# Patient Record
Sex: Female | Born: 1965
Health system: Southern US, Community
[De-identification: ages and names within clinical notes are randomized; demographics above are authoritative.]

## PROBLEM LIST (undated history)

## (undated) DIAGNOSIS — C55 Malignant neoplasm of uterus, part unspecified: Secondary | ICD-10-CM

## (undated) DIAGNOSIS — I8390 Asymptomatic varicose veins of unspecified lower extremity: Secondary | ICD-10-CM

## (undated) DIAGNOSIS — J45909 Unspecified asthma, uncomplicated: Secondary | ICD-10-CM

## (undated) HISTORY — DX: Unspecified asthma, uncomplicated: J45.909

## (undated) HISTORY — PX: APPENDECTOMY: SHX54

## (undated) HISTORY — PX: BREAST BIOPSY: SHX20

## (undated) HISTORY — DX: Malignant neoplasm of uterus, part unspecified: C55

## (undated) HISTORY — DX: Asymptomatic varicose veins of unspecified lower extremity: I83.90

## (undated) HISTORY — PX: ABDOMINAL HYSTERECTOMY: SHX81

---

## 2010-12-30 ENCOUNTER — Other Ambulatory Visit: Payer: Self-pay | Admitting: Obstetrics and Gynecology

## 2010-12-30 DIAGNOSIS — N644 Mastodynia: Secondary | ICD-10-CM

## 2010-12-30 DIAGNOSIS — N63 Unspecified lump in unspecified breast: Secondary | ICD-10-CM

## 2011-01-06 ENCOUNTER — Ambulatory Visit
Admission: RE | Admit: 2011-01-06 | Discharge: 2011-01-06 | Disposition: A | Discharge status: Home / Self Care | Payer: 59 | Source: Ambulatory Visit | Attending: Obstetrics and Gynecology | Admitting: Obstetrics and Gynecology

## 2011-01-06 DIAGNOSIS — N63 Unspecified lump in unspecified breast: Secondary | ICD-10-CM

## 2011-01-06 DIAGNOSIS — N644 Mastodynia: Secondary | ICD-10-CM

## 2011-12-24 ENCOUNTER — Telehealth: Payer: Self-pay | Admitting: Obstetrics and Gynecology

## 2011-12-24 NOTE — Telephone Encounter (Signed)
berbera can you put this pt on mary sch tomorrow in the 10:45 or 11:00 slot ok per [facility]

## 2011-12-24 NOTE — Telephone Encounter (Signed)
Triage/appt

## 2011-12-24 NOTE — Telephone Encounter (Signed)
ND PT 

## 2011-12-25 ENCOUNTER — Encounter: Payer: Self-pay | Admitting: Obstetrics and Gynecology

## 2011-12-25 ENCOUNTER — Ambulatory Visit (INDEPENDENT_AMBULATORY_CARE_PROVIDER_SITE_OTHER): Payer: 59 | Admitting: Obstetrics and Gynecology

## 2011-12-25 VITALS — BP 120/78 | Resp 18 | Wt 175.0 lb

## 2011-12-25 DIAGNOSIS — N951 Menopausal and female climacteric states: Secondary | ICD-10-CM | POA: Insufficient documentation

## 2011-12-25 DIAGNOSIS — N760 Acute vaginitis: Secondary | ICD-10-CM

## 2011-12-25 DIAGNOSIS — B9689 Other specified bacterial agents as the cause of diseases classified elsewhere: Secondary | ICD-10-CM | POA: Insufficient documentation

## 2011-12-25 DIAGNOSIS — J45909 Unspecified asthma, uncomplicated: Secondary | ICD-10-CM | POA: Insufficient documentation

## 2011-12-25 DIAGNOSIS — Z202 Contact with and (suspected) exposure to infections with a predominantly sexual mode of transmission: Secondary | ICD-10-CM

## 2011-12-25 DIAGNOSIS — Z124 Encounter for screening for malignant neoplasm of cervix: Secondary | ICD-10-CM

## 2011-12-25 DIAGNOSIS — N898 Other specified noninflammatory disorders of vagina: Secondary | ICD-10-CM

## 2011-12-25 DIAGNOSIS — A499 Bacterial infection, unspecified: Secondary | ICD-10-CM

## 2011-12-25 DIAGNOSIS — C55 Malignant neoplasm of uterus, part unspecified: Secondary | ICD-10-CM

## 2011-12-25 LAB — POCT WET PREP (WET MOUNT)
Bacteria Wet Prep HPF POC: NEGATIVE
Clue Cells Wet Prep Whiff POC: NEGATIVE
WBC, Wet Prep HPF POC: NEGATIVE
pH: 5

## 2011-12-25 MED ORDER — METRONIDAZOLE 500 MG PO TABS: 500.0000 mg | ORAL_TABLET | Freq: Two times a day (BID) | ORAL | Status: AC

## 2011-12-25 MED ORDER — CALCIUM 600-200 MG-UNIT PO TABS: 1.0000 | ORAL_TABLET | Freq: Every day | ORAL | Status: AC

## 2011-12-25 MED ORDER — PANTOPRAZOLE SODIUM 20 MG PO TBEC: 40.0000 mg | DELAYED_RELEASE_TABLET | Freq: Every day | ORAL | Status: DC

## 2011-12-25 NOTE — Progress Notes (Signed)
  Subjective:    Monique Munoz is a 46 y.o. female who presents for an annual exam. The patient has no complaints today. The patient is sexually active. GYN screening history: last pap: was normal x 3 years. The patient wears seatbelts: yes. The patient participates in regular exercise: yes. Has the patient ever been transfused or tattooed?: not asked. The patient reports that there is not domestic violence in her life.   Menstrual History: OB History    Grav Para Term Preterm Abortions TAB SAB Ect Mult Living                 Nulliarus Menarche age:  No LMP recorded. Patient has had a hysterectomy.  see chart for review  Review of Systems Pertinent items are noted in HPI.    Objective:    BP 120/78  Resp 18  Wt 79.379 kg (175 lb)  General Appearance:    Alert, cooperative, no distress, appears stated age                 Neck:   Supple, symmetrical, trachea midline, no adenopathy;    thyroid:  no enlargement/tenderness/nodules; no carotid   bruit or JVD  Back:     Symmetric, no curvature, ROM normal, no CVA tenderness  Lungs:     Clear to auscultation bilaterally, respirations unlabored  Chest Wall:    No tenderness or deformity   Heart:    Regular rate and rhythm, S1 and S2 normal, no murmur, rub   or gallop  Breast Exam:    No tenderness, masses, or nipple abnormality  Abdomen:     Soft, non-tender, bowel sounds active all four quadrants,    no masses, no organomegaly  Genitalia:    Normal female without lesion, discharge, patien thas complained for thick vaginal discharge.no tenderness, normal adenexa cervix posterior  Rectal:  Visible wnl  Extremities:   Extremities normal, atraumatic, no cyanosis or edema  Pulses:   2+ and symmetric all extremities  Skin:   Skin color, texture, turgor normal, no rashes or lesion.no swelling and healing well. Had used topical anti histamine/ antibiotic ointment daily for past week. Patient had remains of spider bite on leg x1 week ago    Lymph nodes:   Cervical, supraclavicular, and axillary nodes normal  Neurologic:   CNII-XII intact, normal strength, sensation and reflexes    throughout  .    Assessment:    Healthy female exam.   GC and Chlamydia cultures to lab as per patient request. Wet Prep for diagnosis BV - Confirmed Clue Cells. Hx of normal Pap x 3 years  Plan:     Follow up as needed.  and annual, annual mamogram discussed GC/Chl cultures  Wet Prep - BV diagnosis  Tx Flagyl 500 mgs po x 7 days Tx Caltrate D one tab daily Protonix 40 mgs po daily to assist with BV colonization Advise given on management of "Hot Flushes" ( Does not want HRT) f/o I year, Earl Gala, CNM/Mary Elsie Saas 12/25/2011 Let message with Dr. Normand Sloop requesting recommendation if f/o  Pap to continue, discussed at office 7/11 with Dr. Normand Sloop will obtain pathology and surgical op note from hysterectomy now. Lavera Guise, CNM

## 2011-12-25 NOTE — Progress Notes (Signed)
Regular Periods: no partial hyst  Mammogram: yes scheduled next month   Monthly Breast Ex.: no Exercise: yes  Tetanus < 10 years: no Seatbelts: yes  NI. Bladder Functn.: yes Abuse at home: no  Daily BM's: no takes laxatives  Stressful Work: yes  Healthy Diet: yes Sigmoid-Colonoscopy: never  Calcium: no Medical problems this year: possible yeast infection recent antibiotic use    LAST PAP: 2012 wnl  Contraception: Partial hyst  Mammogram:  Scheduled next month   PCP: Marijean Bravo   PMH: no change per pt   FMH: no change per pt

## 2011-12-25 NOTE — Patient Instructions (Signed)
Patient has been advised re constipation due to over use of OTC iron  Patient has been advised re non pharmacological management of Hot Flushes Patient has been advised re use of probiotics for current BV

## 2011-12-25 NOTE — Progress Notes (Signed)
A user error has taken place: encounter opened in error, closed for administrative reasons.

## 2012-01-07 DIAGNOSIS — C55 Malignant neoplasm of uterus, part unspecified: Secondary | ICD-10-CM | POA: Insufficient documentation

## 2012-01-08 ENCOUNTER — Telehealth: Payer: Self-pay

## 2012-01-08 NOTE — Telephone Encounter (Signed)
TC from pt. Informed pt we need records from her hysterectomy per [facility] but she needs to sign a release form before they will release her records to [place]. Pt requests form to be emailed. Emailed consent form. Pt called back to confirm she received email and will complete form today.

## 2012-01-08 NOTE — Telephone Encounter (Signed)
Lm for pt need Op and Path report from her hysterectomy per MK.

## 2012-01-11 NOTE — Telephone Encounter (Signed)
TC from pt changed her mind about getting STD testing during 12/25/11 visit. Pt now wants all STD tests done. Pt wants to come tomorrow at 8:30 pm. Appt scheduled tomorrow @ 8:30 pm. Pt agrees and understands.

## 2012-01-12 ENCOUNTER — Ambulatory Visit: Payer: 59

## 2012-01-12 DIAGNOSIS — Z202 Contact with and (suspected) exposure to infections with a predominantly sexual mode of transmission: Secondary | ICD-10-CM

## 2012-01-12 LAB — HEPATITIS C ANTIBODY: HCV Ab: NEGATIVE

## 2012-01-12 LAB — HIV ANTIBODY (ROUTINE TESTING W REFLEX): HIV: NONREACTIVE

## 2012-01-12 LAB — HEPATITIS B SURFACE ANTIGEN: Hepatitis B Surface Ag: NEGATIVE

## 2012-01-12 LAB — RPR

## 2012-01-18 ENCOUNTER — Telehealth: Payer: Self-pay | Admitting: Obstetrics and Gynecology

## 2012-01-18 NOTE — Telephone Encounter (Signed)
Triage/epic 

## 2012-01-20 ENCOUNTER — Telehealth: Payer: Self-pay

## 2012-01-20 NOTE — Telephone Encounter (Signed)
Triage/res. °

## 2012-01-21 NOTE — Telephone Encounter (Signed)
TYVONNA/RETURN CALL

## 2012-01-21 NOTE — Telephone Encounter (Signed)
Tyvonna/call bck °

## 2012-02-15 ENCOUNTER — Encounter: Payer: Self-pay | Admitting: Obstetrics and Gynecology

## 2012-02-15 NOTE — Progress Notes (Unsigned)
Patient ID: Monique Munoz, female   DOB: May 14, 1966, 46 y.o.   MRN: [id number] 02/03/2012 Discussed with Dr. Normand Sloop records only for granular pap and breast biopsy in past month still needing OP report of hysterectomy and pathology for hx of uterine cancer pap. Instructed to take to [facility] in Med record and wait for results. Lavera Guise, CNM

## 2012-03-08 ENCOUNTER — Encounter: Payer: Self-pay | Admitting: Obstetrics and Gynecology

## 2012-03-08 NOTE — Progress Notes (Signed)
Patient ID: Monique Munoz, female   DOB: [birth year], 46 y.o.   MRN: [id number] Triage staff to notify pt unable after repetitive attempts to get pathology information regarding uterine cancer, needs f/o Pap now. Lavera Guise, CNM

## 2012-03-09 ENCOUNTER — Telehealth: Payer: Self-pay | Admitting: Obstetrics and Gynecology

## 2012-03-09 ENCOUNTER — Telehealth: Payer: Self-pay

## 2012-03-09 NOTE — Telephone Encounter (Signed)
TC TO PT REGARDING MESSAGE BELOW AND SCHEDULED PT WITH ND FOR F/U PAP ON 03/22/12 AT 2:45PM. PT ASKED ME TO ASK MARY TO GIVE PT A CALL AND I INFORMED PT THAT I WILL GIVE MK THE MESSAGE. PT UNDERSTANDS.

## 2012-03-09 NOTE — Telephone Encounter (Signed)
Message copied by Winfred Leeds on Wed Mar 09, 2012 12:12 PM ------      Message from: [name]      Created: Tue Mar 08, 2012  5:19 PM      Regarding: f/o pap with Dr. Karen Kitchens pt unable after repetitive attempts to get pathology information regarding uterine cancer, needs f/o Pap. Dr. Normand Sloop is aware of this pt schedule with her.      Monique Munoz

## 2012-03-22 ENCOUNTER — Encounter: Payer: 59 | Admitting: Obstetrics and Gynecology

## 2012-03-23 ENCOUNTER — Telehealth: Payer: Self-pay

## 2012-03-23 NOTE — Telephone Encounter (Signed)
Spoke with pt rgd appt from 03/22/12 advised pt spoke with nd and she do need pap smear pt was unable to wait yesterday due to ND running late due to surgery pt has appt 03/24/12 at 10:00 with [facility] pt voice understanding

## 2012-03-24 ENCOUNTER — Encounter: Payer: Self-pay | Admitting: Obstetrics and Gynecology

## 2012-03-24 ENCOUNTER — Encounter: Payer: 59 | Admitting: Obstetrics and Gynecology

## 2012-03-24 NOTE — Progress Notes (Deleted)
Last Pap Normal: yes Date: 12/2010 Grade: n/a High Risk HPV: no Vaginal Discharge:no Prior LEEP:no Prior Conization:no Prior Cryotherapy:no Prior Lazer:no  Subjective:    Monique Munoz is a 46 y.o. female, G0P0, who presents for ***.   The following portions of the patient's history were reviewed and updated as appropriate: allergies, current medications, past family history.  Review of Systems Pertinent items are noted in HPI. Breast:Negative for breast lump,nipple discharge or nipple retraction Gastrointestinal: Negative for abdominal pain, change in bowel habits or rectal bleeding Urinary:negative   Objective:    BP 98/78  Wt 181 lb (82.101 kg)    Weight:  Wt Readings from Last 1 Encounters:  03/24/12 181 lb (82.101 kg)          BMI: There is no height on file to calculate BMI.  General Appearance: Alert, appropriate appearance for age. No acute distress GYN exam:   Assessment:    {diagnoses; exam gyn:13148}    Plan:    {gyn plan:315269::"mammogram","pap smear","return annually or prn"}     MD

## 2012-03-24 NOTE — Progress Notes (Signed)
Last Pap Normal: yes Date: 12/30/10 Grade: n/a High Risk HPV: no Vaginal Discharge:no Prior LEEP:no Prior Conization:no Prior Cryotherapy:no Prior Lazer:no

## 2012-03-31 NOTE — Progress Notes (Signed)
This encounter was created in error - please disregard.

## 2012-04-04 ENCOUNTER — Ambulatory Visit (INDEPENDENT_AMBULATORY_CARE_PROVIDER_SITE_OTHER): Payer: 59 | Admitting: Obstetrics and Gynecology

## 2012-04-04 ENCOUNTER — Encounter: Payer: Self-pay | Admitting: Obstetrics and Gynecology

## 2012-04-04 VITALS — BP 110/62

## 2012-04-04 DIAGNOSIS — Z124 Encounter for screening for malignant neoplasm of cervix: Secondary | ICD-10-CM

## 2012-04-04 DIAGNOSIS — N951 Menopausal and female climacteric states: Secondary | ICD-10-CM

## 2012-04-04 NOTE — Progress Notes (Signed)
Last Pap Normal: yes Date: 12/30/10 Grade: n/a High Risk HPV: no Vaginal Discharge:no Prior LEEP:no Prior Conization:no Prior Cryotherapy:no Prior Lazer:no BP 110/62 Pt had a hysterectomy for what she believes was uterine cancer.  She said she had a cone bx and her MD found cancer.  She had an abdominal hysterectomy with preservation of her ovaries.  She did not receive chemo or radiation.  She had this in 2001.  She also c/o menopausal sxs.  She has hot flushes and trouble sleeping.  BP 110/62 Physical Examination: Abdomen - soft, nontender, nondistended, no masses or organomegaly Pelvic vulva and vagina are normal.  Cervix is missing and uterus is missing H/o pelvic cancer.  Menopausal symptoms  It sounds like cervical cancer.  Pt will always need yearly  paps ROI from surgery

## 2012-04-04 NOTE — Patient Instructions (Signed)
Menopause and Herbal Products Menopause is the normal time of life when menstrual periods stop completely. Menopause is complete when you have missed 12 consecutive menstrual periods. It usually occurs between the ages of 48 to 55, with an average age of 51. Very rarely does a woman develop menopause before 47 years old. At menopause, your ovaries stop producing the female hormones, estrogen and progesterone. This can cause undesirable symptoms and also affect your health. Sometimes the symptoms can occur 4 to 5 years before the menopause begins. There is no relationship between menopause and:  Oral contraceptives.  Number of children you had.  Race.  The age your menstrual periods started (menarche). Heavy smokers and very thin women may develop menopause earlier in life. Estrogen and progesterone hormone treatment is the usual method of treating menopausal symptoms. However, there are women who should not take hormone treatment. This is true of:   Women that have breast or uterine cancer.  Women who prefer not to take hormones because of certain side effects (abnormal uterine bleeding).  Women who are afraid that hormones may cause breast cancer.  Women who have a history of liver disease, heart disease, stroke, or blood clots. For these women, there are other medications that may help treat their menopausal symptoms. These medications are found in plants and botanical products. They can be found in the form of herbs, teas, oils, tinctures, and pills.  CAUSES:  The ovaries stop producing the female hormones estrogen and progesterone.  Other causes include:  Surgery to remove both ovaries.  The ovaries stop functioning for no know reason.  Tumors of the pituitary gland in the brain.  Medical disease that affects the ovaries and hormone production.  Radiation treatment to the abdomen or pelvis.  Chemotherapy that affects the ovaries. PHYTOESTROGENS: Phytoestrogen's occur  naturally in plants and plant products. They act like estrogen in the body. Herbal medications are made from these plants and botanical steroids. There are 3 types of phytoestrogens:  Isoflavones (genistein and daidzein) are found in soy, garbanzo beans, miso and tofu foods.  Ligins are found in the shell of seeds. They are used to make oils like flaxseed oil. The bacteria in your intestine act on these foods to produce the estrogen-like hormones.  Coumestans are estrogen-like. Some of the foods they are found in include sunflower seeds and bean sprouts. CONDITIONS AND THERE POSSIBLE HERBAL TREATMENT:  Hot flashes and night sweats.  Soy, black cohosh and evening primrose.  Irritability, insomnia, depression and memory problems.  Chasteberry, ginseng, and soy.  St. John's wort may be helpful for depression. However, there is a concern of it causing cataracts of the eye and may have bad effects on other medications. St. John's wort should not be taken for long time and without your caregiver's advice.  Loss of libido and vaginal and skin dryness.  Wild yam and soy.  Prevention of coronary heart disease and osteoporosis.  Soy and Isoflavones. Several studies have shown that some women benefit from herbal medications, but most of the studies have not consistently shown that these supplements are much better than placebo. Other forms of treatment to help women with menopausal symptoms include a balanced diet, rest, exercise, vitamin and calcium (with vitamin D) supplements, acupuncture, and group therapy when necessary. THOSE WHO SHOULD NOT TAKE HERBAL MEDICATIONS INCLUDE:  Women who are planning on getting pregnant unless told by your caregiver.  Women who are breastfeeding unless told by your caregiver.  Women who are taking other   prescription medications unless told by your caregiver.  Infants, children, and elderly women unless told by your caregiver. Different herbal medications  have different and unmeasured amounts of the herbal ingredients. There are no regulations, quality control, and standardization of the ingredients in herbal medications. Therefore, the amount of the ingredient in the medication may vary from one herb, pill, tea, oil or tincture to another. Many herbal medications can cause serious problems and can even have poisonous effects if taken too much or too long. If problems develop, the medication should be stopped and recorded by your caregiver. HOME CARE INSTRUCTIONS  Do not take or give children herbal medications without your caregiver's advice.  Let your caregiver know all the medications you are taking. This includes prescription, over-the-counter, eye drops, and creams.  Do not take herbal medications longer or more than recommended.  Tell your caregiver about any side effects from the medication. SEEK MEDICAL CARE IF:  You develop a fever of 102 F (38.9 C), or as directed by your caregiver.  You feel sick to your stomach (nauseous), vomit, or have diarrhea.  You develop a rash.  You develop abdominal pain.  You develop severe headaches.  You start to have vision problems.  You feel dizzy or faint.  You start to feel numbness in any part of your body.  You start shaking (have convulsions). Document Released: 12/02/2007 Document Revised: 09/07/2011 Document Reviewed: 07/01/2010 ExitCare Patient Information 2013 [employer].  

## 2012-04-05 LAB — PAP IG W/ RFLX HPV ASCU

## 2012-04-19 ENCOUNTER — Telehealth: Payer: Self-pay

## 2012-04-19 ENCOUNTER — Telehealth: Payer: Self-pay | Admitting: Obstetrics and Gynecology

## 2012-04-19 NOTE — Telephone Encounter (Signed)
Pt made aware pap results were normal. Pt voiced understanding.  [facility] CMA

## 2012-04-19 NOTE — Telephone Encounter (Signed)
Pt was called back regarding message for Lab Results.  [facility] CMA

## 2013-04-03 DIAGNOSIS — L649 Androgenic alopecia, unspecified: Secondary | ICD-10-CM | POA: Insufficient documentation

## 2013-04-03 DIAGNOSIS — L6681 Central centrifugal cicatricial alopecia: Secondary | ICD-10-CM | POA: Insufficient documentation

## 2013-04-03 DIAGNOSIS — L669 Cicatricial alopecia, unspecified: Secondary | ICD-10-CM | POA: Insufficient documentation

## 2013-11-09 ENCOUNTER — Other Ambulatory Visit: Payer: Self-pay | Admitting: Obstetrics and Gynecology

## 2013-11-09 DIAGNOSIS — Z1231 Encounter for screening mammogram for malignant neoplasm of breast: Secondary | ICD-10-CM

## 2013-11-14 ENCOUNTER — Encounter (INDEPENDENT_AMBULATORY_CARE_PROVIDER_SITE_OTHER): Payer: Self-pay

## 2013-11-14 ENCOUNTER — Ambulatory Visit
Admission: RE | Admit: 2013-11-14 | Discharge: 2013-11-14 | Disposition: A | Discharge status: Home / Self Care | Payer: 59 | Source: Ambulatory Visit | Attending: Obstetrics and Gynecology | Admitting: Obstetrics and Gynecology

## 2013-11-14 DIAGNOSIS — Z1231 Encounter for screening mammogram for malignant neoplasm of breast: Secondary | ICD-10-CM

## 2013-11-16 ENCOUNTER — Other Ambulatory Visit: Payer: Self-pay | Admitting: Obstetrics and Gynecology

## 2013-11-16 DIAGNOSIS — R928 Other abnormal and inconclusive findings on diagnostic imaging of breast: Secondary | ICD-10-CM

## 2013-11-28 ENCOUNTER — Ambulatory Visit
Admission: RE | Admit: 2013-11-28 | Discharge: 2013-11-28 | Disposition: A | Discharge status: Home / Self Care | Payer: 59 | Source: Ambulatory Visit | Attending: Obstetrics and Gynecology | Admitting: Obstetrics and Gynecology

## 2013-11-28 DIAGNOSIS — R928 Other abnormal and inconclusive findings on diagnostic imaging of breast: Secondary | ICD-10-CM

## 2014-05-14 ENCOUNTER — Other Ambulatory Visit: Payer: Self-pay | Admitting: Obstetrics and Gynecology

## 2014-05-14 DIAGNOSIS — N6001 Solitary cyst of right breast: Secondary | ICD-10-CM

## 2014-05-21 ENCOUNTER — Ambulatory Visit
Admission: RE | Admit: 2014-05-21 | Discharge: 2014-05-21 | Disposition: A | Discharge status: Home / Self Care | Payer: 59 | Source: Ambulatory Visit | Attending: Obstetrics and Gynecology | Admitting: Obstetrics and Gynecology

## 2014-05-21 ENCOUNTER — Encounter (INDEPENDENT_AMBULATORY_CARE_PROVIDER_SITE_OTHER): Payer: Self-pay

## 2014-05-21 DIAGNOSIS — N6001 Solitary cyst of right breast: Secondary | ICD-10-CM

## 2014-07-06 ENCOUNTER — Emergency Department (HOSPITAL_COMMUNITY): Payer: 59

## 2014-07-06 ENCOUNTER — Encounter (HOSPITAL_COMMUNITY): Payer: Self-pay | Admitting: Emergency Medicine

## 2014-07-06 ENCOUNTER — Emergency Department (HOSPITAL_COMMUNITY)
Admission: EM | Admit: 2014-07-06 | Discharge: 2014-07-06 | Disposition: A | Discharge status: Home / Self Care | Payer: 59 | Attending: Emergency Medicine | Admitting: Emergency Medicine

## 2014-07-06 DIAGNOSIS — Z7951 Long term (current) use of inhaled steroids: Secondary | ICD-10-CM | POA: Insufficient documentation

## 2014-07-06 DIAGNOSIS — Z8542 Personal history of malignant neoplasm of other parts of uterus: Secondary | ICD-10-CM | POA: Insufficient documentation

## 2014-07-06 DIAGNOSIS — M62830 Muscle spasm of back: Secondary | ICD-10-CM

## 2014-07-06 DIAGNOSIS — J45909 Unspecified asthma, uncomplicated: Secondary | ICD-10-CM | POA: Insufficient documentation

## 2014-07-06 DIAGNOSIS — R0602 Shortness of breath: Secondary | ICD-10-CM | POA: Diagnosis not present

## 2014-07-06 DIAGNOSIS — Z79899 Other long term (current) drug therapy: Secondary | ICD-10-CM | POA: Insufficient documentation

## 2014-07-06 DIAGNOSIS — R0789 Other chest pain: Secondary | ICD-10-CM | POA: Insufficient documentation

## 2014-07-06 DIAGNOSIS — R079 Chest pain, unspecified: Secondary | ICD-10-CM | POA: Diagnosis present

## 2014-07-06 LAB — CBC
HCT: 40.5 % (ref 36.0–46.0)
HEMOGLOBIN: 13.9 g/dL (ref 12.0–15.0)
MCH: 31 pg (ref 26.0–34.0)
MCHC: 34.3 g/dL (ref 30.0–36.0)
MCV: 90.2 fL (ref 78.0–100.0)
Platelets: 312 10*3/uL (ref 150–400)
RBC: 4.49 MIL/uL (ref 3.87–5.11)
RDW: 12.3 % (ref 11.5–15.5)
WBC: 11.6 10*3/uL — ABNORMAL HIGH (ref 4.0–10.5)

## 2014-07-06 LAB — BASIC METABOLIC PANEL
Anion gap: 9 (ref 5–15)
BUN: 15 mg/dL (ref 6–23)
CO2: 26 mmol/L (ref 19–32)
Calcium: 9.1 mg/dL (ref 8.4–10.5)
Chloride: 104 mEq/L (ref 96–112)
Creatinine, Ser: 0.78 mg/dL (ref 0.50–1.10)
GFR calc Af Amer: >90 mL/min (ref >90)
Glucose, Bld: 83 mg/dL (ref 70–99)
Potassium: 3.6 mmol/L (ref 3.5–5.1)
Sodium: 139 mmol/L (ref 135–145)

## 2014-07-06 LAB — I-STAT TROPONIN, ED: TROPONIN I, POC: 0 ng/mL (ref 0.00–0.08)

## 2014-07-06 LAB — TROPONIN I

## 2014-07-06 MED ORDER — HYDROCODONE-ACETAMINOPHEN 5-325 MG PO TABS: 1.0000 | ORAL_TABLET | Freq: Four times a day (QID) | ORAL | Status: DC | PRN

## 2014-07-06 MED ORDER — LORAZEPAM 0.5 MG PO TABS: 0.5000 mg | ORAL_TABLET | Freq: Three times a day (TID) | ORAL | Status: DC

## 2014-07-06 NOTE — ED Notes (Signed)
Pt c/o left sided rib pain worse with cough and inspiration into chest; pt sts hx of similar when had PNA; pt sts increased pain with movement

## 2014-07-06 NOTE — ED Provider Notes (Signed)
CSN: [id number]     Arrival date & time 07/06/14  1039 History   First MD Initiated Contact with Patient 07/06/14 1146     Chief Complaint  Patient presents with  . rib pain      (Consider location/radiation/quality/duration/timing/severity/associated sxs/prior Treatment) HPI   49 y/o female who is sent from primary care office for back pain, cp, and sob. Patient was reaching down toward her ankle whenshe had sudden onset  Back spasm and chest pain. She was unable to move out of the position. She felt her chest wall lock down . She was unable to take deep breaths due to pain. She had severe pain with movement and twisting. Derry Lory denies history of PE or DVT, has not history of cancer treatment, recent immobilization, recent trauma or surgery, denies hemoptysis, is not taking exogenous estrogen and has no unilateral leg swelling. She was seen at pcp and given muscle relaxer and toradol with dx of muscle spasm. As she did not have complete relief of sxs she was sent to the ED. The patient has recently started working out with a trainer and has been weight lifting. She is PERC negative in the ED.Denies DOE,, chest tightness or pressure, radiation to left arm, jaw or back, nausea, or diaphoresis.   Past Medical History  Diagnosis Date  . Asthma   . Uterine cancer    Past Surgical History  Procedure Laterality Date  . Abdominal hysterectomy  10 yrs ago    Hx ca   History reviewed. No pertinent family history. History  Substance Use Topics  . Smoking status: Never Smoker   . Smokeless tobacco: Never Used  . Alcohol Use: Yes     Comment: occasional wine   OB History    Gravida Para Term Preterm AB TAB SAB Ectopic Multiple Living   0              Review of Systems    Allergies  Peanuts; Shellfish allergy; and Fruit & vegetable daily  Home Medications   Prior to Admission medications   Medication Sig Start Date End Date Taking? Authorizing Provider  albuterol (PROVENTIL  HFA;VENTOLIN HFA) 108 (90 BASE) MCG/ACT inhaler Inhale 2 puffs into the lungs every 6 (six) hours as needed for wheezing or shortness of breath.   Yes Historical Provider, MD  albuterol (PROVENTIL) (2.5 MG/3ML) 0.083% nebulizer solution Take 2.5 mg by nebulization every 6 (six) hours as needed for wheezing or shortness of breath.    Yes Historical Provider, MD  fluticasone-salmeterol (ADVAIR HFA) 115-21 MCG/ACT inhaler Inhale 2 puffs into the lungs 2 (two) times daily.   Yes Historical Provider, MD  montelukast (SINGULAIR) 10 MG tablet Take 10 mg by mouth daily. 06/25/14  Yes Historical Provider, MD  Multiple Vitamins-Minerals (MULTIVITAMIN PO) Take 1 tablet by mouth daily.   Yes Historical Provider, MD  Calcium 600-200 MG-UNIT per tablet Take 1 tablet by mouth daily. 12/25/11 12/24/12  Hetty Ely, CNM  HYDROcodone-acetaminophen (NORCO) 5-325 MG per tablet Take 1 tablet by mouth every 6 (six) hours as needed for moderate pain. 07/06/14   Margarita Mail, PA-C  LORazepam (ATIVAN) 0.5 MG tablet Take 1 tablet (0.5 mg total) by mouth every 8 (eight) hours. 07/06/14   Margarita Mail, PA-C  pantoprazole (PROTONIX) 20 MG tablet Take 2 tablets (40 mg total) by mouth daily. 12/25/11 12/24/12  Hetty Ely, CNM   BP 96/55 mmHg  Pulse 68  Temp(Src) 97.6 F (36.4 C) (Oral)  Resp 15  SpO2  100% Physical Exam  Constitutional: She is oriented to person, place, and time. She appears well-developed and well-nourished. No distress.  HENT:  Head: Normocephalic and atraumatic.  Eyes: Conjunctivae are normal. No scleral icterus.  Neck: Normal range of motion.  Cardiovascular: Normal rate, regular rhythm and normal heart sounds.  Exam reveals no gallop and no friction rub.   No murmur heard. Pulmonary/Chest: Effort normal and breath sounds normal. No respiratory distress.   She exhibits tenderness.    Abdominal: Soft. Bowel sounds are normal. She exhibits no distension and no mass. There is no tenderness. There  is no guarding.  Neurological: She is alert and oriented to person, place, and time.  Skin: Skin is warm and dry. She is not diaphoretic.  Nursing note and vitals reviewed.   ED Course  Procedures (including critical care time) Labs Review Labs Reviewed  CBC - Abnormal; Notable for the following:    WBC 11.6 (*)    All other components within normal limits  TROPONIN I  BASIC METABOLIC PANEL  I-STAT TROPOININ, ED    Imaging Review No results found.   EKG Interpretation   Date/Time:  Friday July 06 2014 10:45:54 EST Ventricular Rate:  70 PR Interval:  138 QRS Duration: 80 QT Interval:  396 QTC Calculation: 427 R Axis:   91 Text Interpretation:  Normal sinus rhythm Rightward axis Borderline ECG ED  PHYSICIAN INTERPRETATION AVAILABLE IN [facility] Confirmed by TEST,  Record ([id number]) on 07/08/2014 8:55:52 AM      MDM   Final diagnoses:  SOB (shortness of breath)  Back muscle spasm  Acute chest wall pain    patient with pain on palpation. Reproducible. WBC count is elevated which I feel is acute phase reaction CXR is unremarkable. EKG shows right axis with no signs of ischemia. Her troponin is negative and HEART score is 2 making her low risk for MACE.  Patient is to be discharged with recommendation to follow up with PCP in regards to today's hospital visit. Chest pain is not likely of cardiac or pulmonary etiology d/t presentation, perc negative, VSS, no tracheal deviation, no JVD or new murmur, RRR, breath sounds equal bilaterally, EKG without acute abnormalities, negative troponin, and negative CXR. Pt has been advised to return to the ED is CP becomes exertional, associated with diaphoresis or nausea, radiates to left jaw/arm, worsens or becomes concerning in any way. Pt appears reliable for follow up and is agreeable to discharge.   Case has been discussed with and seen by Dr. Johnney Killian who agrees with the above plan to discharge.    Margarita Mail,  PA-C 07/12/14 [place], MD 07/13/14 [phone]

## 2014-07-06 NOTE — Discharge Instructions (Signed)
Muscle Cramps and Spasms Muscle cramps and spasms occur when a muscle or muscles tighten and you have no control over this tightening (involuntary muscle contraction). They are a common problem and can develop in any muscle. The most common place is in the calf muscles of the leg. Both muscle cramps and muscle spasms are involuntary muscle contractions, but they also have differences:   Muscle cramps are sporadic and painful. They may last a few seconds to a quarter of an hour. Muscle cramps are often more forceful and last longer than muscle spasms.  Muscle spasms may or may not be painful. They may also last just a few seconds or much longer. CAUSES  It is uncommon for cramps or spasms to be due to a serious underlying problem. In many cases, the cause of cramps or spasms is unknown. Some common causes are:   Overexertion.   Overuse from repetitive motions (doing the same thing over and over).   Remaining in a certain position for a long period of time.   Improper preparation, form, or technique while performing a sport or activity.   Dehydration.   Injury.   Side effects of some medicines.   Abnormally low levels of the salts and ions in your blood (electrolytes), especially potassium and calcium. This could happen if you are taking water pills (diuretics) or you are pregnant.  Some underlying medical problems can make it more likely to develop cramps or spasms. These include, but are not limited to:   Diabetes.   Parkinson disease.   Hormone disorders, such as thyroid problems.   Alcohol abuse.   Diseases specific to muscles, joints, and bones.   Blood vessel disease where not enough blood is getting to the muscles.  HOME CARE INSTRUCTIONS   Stay well hydrated. Drink enough water and fluids to keep your urine clear or pale yellow.  It may be helpful to massage, stretch, and relax the affected muscle.  For tight or tense muscles, use a warm towel, heating  pad, or hot shower water directed to the affected area.  If you are sore or have pain after a cramp or spasm, applying ice to the affected area may relieve discomfort.  Put ice in a plastic bag.  Place a towel between your skin and the bag.  Leave the ice on for 15-20 minutes, 03-04 times a day.  Medicines used to treat a known cause of cramps or spasms may help reduce their frequency or severity. Only take over-the-counter or prescription medicines as directed by your caregiver. SEEK MEDICAL CARE IF:  Your cramps or spasms get more severe, more frequent, or do not improve over time.  MAKE SURE YOU:   Understand these instructions.  Will watch your condition.  Will get help right away if you are not doing well or get worse. Document Released: 12/05/2001 Document Revised: 10/10/2012 Document Reviewed: 06/01/2012 [facility] Patient Information 2015 [place], [place]. This information is not intended to replace advice given to you by your health care provider. Make sure you discuss any questions you have with your health care provider. CChest Wall Pain Chest wall pain is pain in or around the bones and muscles of your chest. It may take up to 6 weeks to get better. It may take longer if you must stay physically active in your work and activities.  CAUSES  Chest wall pain may happen on its own. However, it may be caused by:  A viral illness like the flu.  Injury.  Coughing.  Exercise.  Arthritis.  Fibromyalgia.  Shingles. HOME CARE INSTRUCTIONS   Avoid overtiring physical activity. Try not to strain or perform activities that cause pain. This includes any activities using your chest or your abdominal and side muscles, especially if heavy weights are used.  Put ice on the sore area.  Put ice in a plastic bag.  Place a towel between your skin and the bag.  Leave the ice on for 15-20 minutes per hour while awake for the first 2 days.  Only take over-the-counter or  prescription medicines for pain, discomfort, or fever as directed by your caregiver. SEEK IMMEDIATE MEDICAL CARE IF:   Your pain increases, or you are very uncomfortable.  You have a fever.  Your chest pain becomes worse.  You have new, unexplained symptoms.  You have nausea or vomiting.  You feel sweaty or lightheaded.  You have a cough with phlegm (sputum), or you cough up blood. MAKE SURE YOU:   Understand these instructions.  Will watch your condition.  Will get help right away if you are not doing well or get worse. Document Released: 06/15/2005 Document Revised: 09/07/2011 Document Reviewed: 02/09/2011 [facility] Patient Information 2015 [place], [place]. This information is not intended to replace advice given to you by your health care provider. Make sure you discuss any questions you have with your health care provider.

## 2014-10-01 ENCOUNTER — Encounter: Payer: Self-pay | Admitting: Gastroenterology

## 2014-10-26 ENCOUNTER — Other Ambulatory Visit: Payer: Self-pay

## 2014-10-26 DIAGNOSIS — Z1231 Encounter for screening mammogram for malignant neoplasm of breast: Secondary | ICD-10-CM

## 2014-11-09 ENCOUNTER — Ambulatory Visit (AMBULATORY_SURGERY_CENTER): Payer: Self-pay | Admitting: *Deleted

## 2014-11-09 VITALS — Ht 63.0 in | Wt 149.4 lb

## 2014-11-09 DIAGNOSIS — Z1211 Encounter for screening for malignant neoplasm of colon: Secondary | ICD-10-CM

## 2014-11-09 MED ORDER — NA SULFATE-K SULFATE-MG SULF 17.5-3.13-1.6 GM/177ML PO SOLN: 1.0000 | Freq: Once | ORAL | Status: DC

## 2014-11-09 NOTE — Progress Notes (Signed)
Denies allergies to eggs or soy products. Denies complications with sedation or anesthesia. Denies O2 use. Denies use of diet or weight loss medications.  Emmi instructions given for colonoscopy.  

## 2014-11-23 ENCOUNTER — Encounter: Payer: Self-pay | Admitting: Gastroenterology

## 2014-11-23 ENCOUNTER — Ambulatory Visit (AMBULATORY_SURGERY_CENTER): Payer: 59 | Admitting: Gastroenterology

## 2014-11-23 VITALS — BP 104/71 | HR 71 | Temp 97.9°F | Resp 16 | Ht 63.0 in | Wt 149.0 lb

## 2014-11-23 DIAGNOSIS — Z1211 Encounter for screening for malignant neoplasm of colon: Secondary | ICD-10-CM | POA: Diagnosis present

## 2014-11-23 MED ORDER — SODIUM CHLORIDE 0.9 % IV SOLN: 500.0000 mL | INTRAVENOUS | Status: DC

## 2014-11-23 NOTE — Progress Notes (Signed)
Report to PACU, RN, vss, BBS= Clear.  

## 2014-11-23 NOTE — Op Note (Signed)
[facility]  [address] [place], [id number]   COLONOSCOPY PROCEDURE REPORT  PATIENT: Monique Munoz, Monique Munoz  MR#: [id number] BIRTHDATE: 26-Feb-1966 , 49  yrs. old GENDER: female ENDOSCOPIST: Inda Castle, MD REFERRED QM:GNOIBBC Haygood, M.D. PROCEDURE DATE:  11/23/2014 PROCEDURE:   Colonoscopy, screening First Screening Colonoscopy - Avg.  risk and is 50 yrs.  old or older Yes.  Prior Negative Screening - Now for repeat screening. N/A  History of Adenoma - Now for follow-up colonoscopy & has been > or = to 3 yrs.  N/A  Recommend repeat exam, <10 yrs? No ASA CLASS:   Class II INDICATIONS:Colorectal Neoplasm Risk Assessment for this procedure is average risk. MEDICATIONS: Monitored anesthesia care and Propofol 200 mg IV  DESCRIPTION OF PROCEDURE:   After the risks benefits and alternatives of the procedure were thoroughly explained, informed consent was obtained.  The digital rectal exam revealed no abnormalities of the rectum.   The LB [id number]  endoscope was introduced through the anus and advanced to the cecum, which was identified by both the appendix and ileocecal valve. No adverse events experienced.   The quality of the prep was (Suprep was used) excellent.  The instrument was then slowly withdrawn as the colon was fully examined. Estimated blood loss is zero unless otherwise noted in this procedure report.      COLON FINDINGS: A normal appearing cecum, ileocecal valve, and appendiceal orifice were identified.  The ascending, transverse, descending, sigmoid colon, and rectum appeared unremarkable. Retroflexed views revealed no abnormalities. The time to cecum = 3.2 Withdrawal time = 6.7   The scope was withdrawn and the procedure completed. COMPLICATIONS: There were no immediate complications.  ENDOSCOPIC IMPRESSION: Normal colonoscopy  RECOMMENDATIONS: Continue current colorectal screening recommendations for "routine risk" patients with a  repeat colonoscopy in 10 years.  eSigned:  Inda Castle, MD 11/23/2014 3:13 PM   cc:

## 2014-11-23 NOTE — Patient Instructions (Signed)
YOU HAD AN ENDOSCOPIC PROCEDURE TODAY AT THE [facility]:   Refer to the procedure report that was given to you for any specific questions about what was found during the examination.  If the procedure report does not answer your questions, please call your gastroenterologist to clarify.  If you requested that your care partner not be given the details of your procedure findings, then the procedure report has been included in a sealed envelope for you to review at your convenience later.  YOU SHOULD EXPECT: Some feelings of bloating in the abdomen. Passage of more gas than usual.  Walking can help get rid of the air that was put into your GI tract during the procedure and reduce the bloating. If you had a lower endoscopy (such as a colonoscopy or flexible sigmoidoscopy) you may notice spotting of blood in your stool or on the toilet paper. If you underwent a bowel prep for your procedure, you may not have a normal bowel movement for a few days.  Please Note:  You might notice some irritation and congestion in your nose or some drainage.  This is from the oxygen used during your procedure.  There is no need for concern and it should clear up in a day or so.  SYMPTOMS TO REPORT IMMEDIATELY:   Following lower endoscopy (colonoscopy or flexible sigmoidoscopy):  Excessive amounts of blood in the stool  Significant tenderness or worsening of abdominal pains  Swelling of the abdomen that is new, acute  Fever of 100F or higher  For urgent or emergent issues, a gastroenterologist can be reached at any hour by calling [phone].   DIET: Your first meal following the procedure should be a small meal and then it is ok to progress to your normal diet. Heavy or fried foods are harder to digest and may make you feel nauseous or bloated.  Likewise, meals heavy in dairy and vegetables can increase bloating.  Drink plenty of fluids but you should avoid alcoholic beverages for 24  hours.  ACTIVITY:  You should plan to take it easy for the rest of today and you should NOT DRIVE or use heavy machinery until tomorrow (because of the sedation medicines used during the test).    FOLLOW UP: Our staff will call the number listed on your records the next business day following your procedure to check on you and address any questions or concerns that you may have regarding the information given to you following your procedure. If we do not reach you, we will leave a message.  However, if you are feeling well and you are not experiencing any problems, there is no need to return our call.  We will assume that you have returned to your regular daily activities without incident.  If any biopsies were taken you will be contacted by phone or by letter within the next 1-3 weeks.  Please call us at [phone] if you have not heard about the biopsies in 3 weeks.    SIGNATURES/CONFIDENTIALITY: You and/or your care partner have signed paperwork which will be entered into your electronic medical record.  These signatures attest to the fact that that the information above on your After Visit Summary has been reviewed and is understood.  Full responsibility of the confidentiality of this discharge information lies with you and/or your care-partner.  Normal exam.  Repeat colonoscopy in 10 years 2026.  

## 2014-11-25 ENCOUNTER — Telehealth: Payer: Self-pay | Admitting: Internal Medicine

## 2014-11-25 NOTE — Telephone Encounter (Signed)
Patient calls reporting she feels very bloated and has been unable to pass gas since getting home from colonoscopy on 11/23/2014 Was able to pass gas in the recovery room Having mild upper abdominal discomfort but denies pain. Feels some nausea but no vomiting. No fevers or chills. By mouth intake has been minimal because "I just feel so bloated" Has been walking around and trying to stay active but still no flatus I recommended frequent position changes. Trial of Fleet's enema to try to stimulate flatus If no better, or worsening to include vomiting, abdominal pain, fever or chills, she is instructed to call me back immediately and go to the ER. She voices understanding and thanked me for the call Will alert Dr. Deatra Ina

## 2014-11-27 ENCOUNTER — Telehealth: Payer: Self-pay | Admitting: *Deleted

## 2014-11-27 ENCOUNTER — Other Ambulatory Visit: Payer: Self-pay

## 2014-11-27 DIAGNOSIS — R109 Unspecified abdominal pain: Secondary | ICD-10-CM

## 2014-11-27 NOTE — Telephone Encounter (Signed)
She needs plain abd films (flat, upright abd films) to rule out obstruction.  Also cbc, bmet  thanks

## 2014-11-27 NOTE — Telephone Encounter (Signed)
Name identifier, left message, follow-up 

## 2014-11-27 NOTE — Telephone Encounter (Signed)
Patient agrees to this plan. °

## 2014-11-27 NOTE — Telephone Encounter (Signed)
Doc of the Day-Called to check on the patient. She reports she is unchanged in her symptoms. She has tried the [employer]. She has not eaten but she is drinking fluids. She will bear down hard but "cannot push anything out". She can get tiny belches if she drink ginger ale.Declines an appointment.

## 2014-11-29 NOTE — Telephone Encounter (Signed)
Patient did not go for the KUB. Called to patient. No answer. Left message to call us if she is still having problems.

## 2015-01-04 ENCOUNTER — Ambulatory Visit
Admission: RE | Admit: 2015-01-04 | Discharge: 2015-01-04 | Disposition: A | Discharge status: Home / Self Care | Payer: 59 | Source: Ambulatory Visit

## 2015-01-04 DIAGNOSIS — Z1231 Encounter for screening mammogram for malignant neoplasm of breast: Secondary | ICD-10-CM

## 2015-02-28 ENCOUNTER — Other Ambulatory Visit: Payer: Self-pay | Admitting: Obstetrics and Gynecology

## 2015-02-28 DIAGNOSIS — N63 Unspecified lump in unspecified breast: Secondary | ICD-10-CM

## 2015-03-08 ENCOUNTER — Ambulatory Visit
Admission: RE | Admit: 2015-03-08 | Discharge: 2015-03-08 | Disposition: A | Discharge status: Home / Self Care | Payer: 59 | Source: Ambulatory Visit | Attending: Obstetrics and Gynecology | Admitting: Obstetrics and Gynecology

## 2015-03-08 DIAGNOSIS — N63 Unspecified lump in unspecified breast: Secondary | ICD-10-CM

## 2015-05-08 ENCOUNTER — Other Ambulatory Visit: Payer: Self-pay | Admitting: *Deleted

## 2015-05-08 ENCOUNTER — Encounter: Payer: Self-pay | Admitting: Vascular Surgery

## 2015-05-08 DIAGNOSIS — I83813 Varicose veins of bilateral lower extremities with pain: Secondary | ICD-10-CM

## 2015-06-07 ENCOUNTER — Encounter: Payer: Self-pay | Admitting: Vascular Surgery

## 2015-06-14 ENCOUNTER — Encounter: Payer: Self-pay | Admitting: Vascular Surgery

## 2015-06-14 ENCOUNTER — Ambulatory Visit (HOSPITAL_COMMUNITY)
Admission: RE | Admit: 2015-06-14 | Discharge: 2015-06-14 | Disposition: A | Discharge status: Home / Self Care | Payer: 59 | Source: Ambulatory Visit | Attending: Vascular Surgery | Admitting: Vascular Surgery

## 2015-06-14 ENCOUNTER — Ambulatory Visit (INDEPENDENT_AMBULATORY_CARE_PROVIDER_SITE_OTHER): Payer: 59 | Admitting: Vascular Surgery

## 2015-06-14 VITALS — BP 107/60 | HR 69 | Temp 98.1°F | Resp 14 | Ht 64.0 in | Wt 149.0 lb

## 2015-06-14 DIAGNOSIS — M7989 Other specified soft tissue disorders: Secondary | ICD-10-CM

## 2015-06-14 DIAGNOSIS — I83899 Varicose veins of unspecified lower extremities with other complications: Secondary | ICD-10-CM | POA: Insufficient documentation

## 2015-06-14 DIAGNOSIS — I83813 Varicose veins of bilateral lower extremities with pain: Secondary | ICD-10-CM | POA: Diagnosis not present

## 2015-06-14 DIAGNOSIS — I872 Venous insufficiency (chronic) (peripheral): Secondary | ICD-10-CM | POA: Diagnosis not present

## 2015-06-14 DIAGNOSIS — I83893 Varicose veins of bilateral lower extremities with other complications: Secondary | ICD-10-CM | POA: Diagnosis not present

## 2015-06-14 NOTE — Progress Notes (Signed)
HISTORY AND PHYSICAL     CC:  Leg swelling/pain Referring Provider:  Eldred Manges, MD  HPI: This is a 49 y.o. female who presents today with complaints of bilateral leg pain with left greater than right and swelling of the left leg.  She states that standing and working out aggravate her sx. She says that when she wraps her legs with ace wraps, this helps sometimes.  She states that if she has some slippers that are form fitting, the swelling/pain is better.  She says that she has pain in both her legs all the time, which has been going on for about a year.  She does not describe claudication sx.  She has not had any pregnancies.    She states that she has had some tingling and numbness of her left arm for ~ 1-3 months now.  She states that she remembers leaving [facility] and turning her head to the left and she got the tingling numbness down her left arm.  She states the pain in her neck has resolved, but the tingling and numbness are persistent.  She is now sleeping on her right side.  She does have color changes to the ring finger on the left hand.  She denies any symptoms in her lower leg and also denies any stroke symptoms.  She works out quite often at [employer].  She does not do any lifting, but does a lot of resistance training with pushups, ropes, etc.    She has asthma and does have inhalers for this.    Past Medical History  Diagnosis Date  . Asthma   . Uterine cancer ([place])   . Varicose veins     Past Surgical History  Procedure Laterality Date  . Abdominal hysterectomy  10 yrs ago    Hx ca  . Appendectomy    . Breast biopsy      benign    Allergies  Allergen Reactions  . Peanuts [Peanut Oil] Anaphylaxis  . Shellfish Allergy Anaphylaxis    Also iodine : now carries Epi Pen  . Fruit & Vegetable Daily [Nutritional Supplements]     Grapes, pears, carrots,    . Iodine     Current Outpatient Prescriptions  Medication Sig Dispense Refill  . albuterol  (PROVENTIL HFA;VENTOLIN HFA) 108 (90 BASE) MCG/ACT inhaler Inhale 2 puffs into the lungs every 6 (six) hours as needed for wheezing or shortness of breath.    Marland Kitchen albuterol (PROVENTIL) (2.5 MG/3ML) 0.083% nebulizer solution Take 2.5 mg by nebulization every 6 (six) hours as needed for wheezing or shortness of breath.     . fluticasone (FLONASE) 50 MCG/ACT nasal spray Place into both nostrils daily.    . fluticasone-salmeterol (ADVAIR HFA) 115-21 MCG/ACT inhaler Inhale 2 puffs into the lungs 2 (two) times daily.    . montelukast (SINGULAIR) 10 MG tablet Take 10 mg by mouth daily.  5  . Multiple Vitamins-Minerals (MULTIVITAMIN PO) Take 1 tablet by mouth daily.    . Calcium 600-200 MG-UNIT per tablet Take 1 tablet by mouth daily. 30 tablet 11  . HYDROcodone-acetaminophen (NORCO) 5-325 MG per tablet Take 1 tablet by mouth every 6 (six) hours as needed for moderate pain. (Patient not taking: Reported on 11/09/2014) 30 tablet 0  . LORazepam (ATIVAN) 0.5 MG tablet Take 1 tablet (0.5 mg total) by mouth every 8 (eight) hours. (Patient not taking: Reported on 06/14/2015) 30 tablet 0   No current facility-administered medications for this visit.  Family History  Problem Relation Age of Onset  . Colon cancer Neg Hx   . Heart disease Father   . Stroke Father   . Cancer Father   . Crohn's disease Sister   . Colon polyps Sister   . Breast cancer Paternal Aunt   . Breast cancer Paternal Grandmother     Social History   Social History  . Marital Status: Single    Spouse Name: N/A  . Number of Children: N/A  . Years of Education: N/A   Occupational History  . Not on file.   Social History Main Topics  . Smoking status: Never Smoker   . Smokeless tobacco: Never Used  . Alcohol Use: 4.2 oz/week    7 Glasses of wine per week     Comment: occasional wine  . Drug Use: No  . Sexual Activity: Not on file     Comment: hysterectomy    Other Topics Concern  . Not on file   Social History  Narrative     ROS: [x]  Positive   [ ]  Negative   [ ]  All sytems reviewed and are negative  Cardiovascular: []  chest pain/pressure []  palpitations []  SOB lying flat []  DOE [x]  pain in legs  []  pain in feet when lying flat []  hx of DVT []  hx of phlebitis []  swelling in legs []  varicose veins  Pulmonary: []  productive cough []  asthma []  wheezing  Neurologic: [x]  weakness in [x]  left arm x 1-3 months []  legs [x]  numbness in [x]  left arm x 1-3 months []  legs [] difficulty speaking or slurred speech []  temporary loss of vision in one eye []  dizziness  Hematologic: []  bleeding problems []  problems with blood clotting easily  GI []  vomiting blood []  blood in stool  GU: []  burning with urination []  blood in urine  Psychiatric: []  hx of major depression  Integumentary: []  rashes []  ulcers  Constitutional: []  fever []  chills   PHYSICAL EXAMINATION:  Filed Vitals:   06/14/15 1305  BP: 107/60  Pulse: 69  Temp: 98.1 F (36.7 C)  Resp: 14   Body mass index is 25.56 kg/(m^2).  General:  WDWN in NAD Gait: Normal HENT: WNL, normocephalic Pulmonary: normal non-labored breathing , without Rales, rhonchi,  wheezing Cardiac: RRR, without  Murmurs, rubs or gallops; without carotid bruits Abdomen: soft, NT, no masses Skin: without rashes Vascular Exam/Pulses:  Right Left  Radial 2+ (normal) 2+ (normal)  Ulnar 1+ (weak) 1+ (weak)  Popliteal Unable to palpate  Unable to palpate   DP 2+ (normal) 2+ (normal)  PT Unable to palpate  Unable to palpate    Extremities: without ischemic changes, without Gangrene , without cellulitis; without open wounds; reticular and spider veins are present on the posterior thighs just proximal to the popliteal space Musculoskeletal: no muscle wasting or atrophy  Neurologic: A&O X 3; Appropriate Affect ; SENSATION: normal; MOTOR FUNCTION:  moving all extremities equally. Speech is fluent/normal Psychiatric: Judgment intact, Mood &  affect appropriate for pt's clinical situation Lymph : No Cervical, Axillary, or Inguinal lymphadenopathy     Non-Invasive Vascular Imaging:   Lower Extremity Venous Duplex Reflux Evaluation 06/14/15: -Bilateral lower extremity deep venous system is patent and compressible from the groin to the knee segment -deep venous reflux is noted in the CFV bilaterally -bilateral GSV and SSV is patent, compressible, and competent  Pt meds includes: Statin:  No. Beta Blocker:  No. Aspirin:  No. ACEI:  No. ARB:  No. Other Antiplatelet/Anticoagulant:  No.    ASSESSMENT/PLAN:: 49 y.o. female with leg swelling and leg pain   -The pt's superficial venous system is competent.  Her duplex is negative for DVT -she does have some deep CFV reflux bilaterally, which compression has been recommended.   -she does have weakness, numbness, and tingling of the LUE, which sounds related to possible herniated disk in her neck.  Dr. Bridgett Larsson recommended that she hold on her resistance training and be evaluated by her PCP for workup of this.  He has advised her not to ignore this to try to avoid permanent damage.   -she will f/u with [facility] as needed.    Leontine Locket, PA-C [facility] [phone]  Clinic MD:  Pt seen and examined in conjunction with Dr. Bridgett Larsson  Addendum  I have independently interviewed and examined the patient, and I agree with the physician assistant's findings.  Nothing but compressive therapy is suggested at this point, given limited reflux present.  In regards to her L arm and neck, her sx are suspicious for possible HNP.  She going to her PCP for evaluation for such after this appt.  Adele Barthel, MD [facility] Office: [phone] Pager: [phone]  06/14/2015, 3:25 PM

## 2015-06-18 ENCOUNTER — Ambulatory Visit
Admission: RE | Admit: 2015-06-18 | Discharge: 2015-06-18 | Disposition: A | Discharge status: Home / Self Care | Payer: 59 | Source: Ambulatory Visit | Attending: Family Medicine | Admitting: Family Medicine

## 2015-06-18 ENCOUNTER — Other Ambulatory Visit: Payer: Self-pay | Admitting: Family Medicine

## 2015-06-18 DIAGNOSIS — G540 Brachial plexus disorders: Secondary | ICD-10-CM

## 2015-07-03 ENCOUNTER — Encounter: Payer: 59 | Admitting: Surgery

## 2016-07-03 DIAGNOSIS — L249 Irritant contact dermatitis, unspecified cause: Secondary | ICD-10-CM | POA: Diagnosis not present

## 2016-07-27 DIAGNOSIS — J0100 Acute maxillary sinusitis, unspecified: Secondary | ICD-10-CM | POA: Diagnosis not present

## 2016-09-04 ENCOUNTER — Ambulatory Visit (INDEPENDENT_AMBULATORY_CARE_PROVIDER_SITE_OTHER): Payer: 59 | Admitting: Allergy

## 2016-09-04 ENCOUNTER — Encounter (INDEPENDENT_AMBULATORY_CARE_PROVIDER_SITE_OTHER): Payer: Self-pay

## 2016-09-04 ENCOUNTER — Encounter: Payer: Self-pay | Admitting: Allergy

## 2016-09-04 VITALS — BP 92/62 | HR 88 | Temp 98.2°F | Resp 21 | Ht 64.0 in | Wt 161.4 lb

## 2016-09-04 DIAGNOSIS — T781XXD Other adverse food reactions, not elsewhere classified, subsequent encounter: Secondary | ICD-10-CM

## 2016-09-04 DIAGNOSIS — J4540 Moderate persistent asthma, uncomplicated: Secondary | ICD-10-CM

## 2016-09-04 DIAGNOSIS — Z91018 Allergy to other foods: Secondary | ICD-10-CM | POA: Diagnosis not present

## 2016-09-04 DIAGNOSIS — H1010 Acute atopic conjunctivitis, unspecified eye: Secondary | ICD-10-CM

## 2016-09-04 DIAGNOSIS — R21 Rash and other nonspecific skin eruption: Secondary | ICD-10-CM

## 2016-09-04 DIAGNOSIS — J309 Allergic rhinitis, unspecified: Secondary | ICD-10-CM | POA: Diagnosis not present

## 2016-09-04 MED ORDER — DESONIDE 0.05 % EX OINT: 1.0000 "application " | TOPICAL_OINTMENT | Freq: Two times a day (BID) | CUTANEOUS | 0 refills | Status: DC

## 2016-09-04 MED ORDER — MONTELUKAST SODIUM 10 MG PO TABS: 10.0000 mg | ORAL_TABLET | Freq: Every day | ORAL | 5 refills | Status: DC

## 2016-09-04 MED ORDER — BUDESONIDE-FORMOTEROL FUMARATE 160-4.5 MCG/ACT IN AERO: 2.0000 | INHALATION_SPRAY | Freq: Two times a day (BID) | RESPIRATORY_TRACT | 5 refills | Status: AC

## 2016-09-04 MED ORDER — FLUTICASONE PROPIONATE 50 MCG/ACT NA SUSP: 2.0000 | Freq: Every day | NASAL | 5 refills | Status: DC

## 2016-09-04 MED ORDER — EPINEPHRINE 0.3 MG/0.3ML IJ SOAJ: 0.3000 mg | Freq: Once | INTRAMUSCULAR | 2 refills | Status: AC

## 2016-09-04 NOTE — Progress Notes (Signed)
Follow-up Note  RE: Khandi Kernes MRN: [id number] DOB: 11/19/1965 Date of Office Visit: 09/04/2016   History of present illness: Monique Munoz is a 51 y.o. female presenting today for follow-up of asthma, allergic rhinitis, food allergy and oral pollinosis syndrome.  She was last seen in the office on 01/15/2014 by Dr. Shaune Leeks.  Over the past 3 years she has spent most of her time in [place] and has returned to [place] in the past month.  She reports her allergy symptoms are worse here in [place].   She also reports having more cough and chest tightness.  She reports wheezing more at night and when she is outside.  She was on Advair disc but has been out for past month.  She reports she had weaned herself off as she was doing well.  She is using albuterol about 3 times a day since she has been out of Advair.  She also takes singulair daily.  She is also waking up from sleep as well.  With her allergy symptoms she has postnasal drip and reports that her  voice comes/goes.  She feels sinus pressure around her nose.  She use flonase and reports she usually have relief from flonase but it is not working as well currently.  She is taking OTC claritin twice a day.  She also has a facial rash. Rash is not itchy but reports it comes around the spring time.  She does use desonide gel provided by a doctor in [place] but feels it makes her skin warm and dry.  She does not use any moisturizers.   She continues to avoid peanut, pea, shellfish, carrot, apple and plums. She recently had a pair that she peeled and reports that she started having lip and tongue tingling with ingestion.  She needs a new EpiPen.      Review of systems: Review of Systems  Constitutional: Negative for chills, fever and malaise/fatigue.  HENT: Positive for congestion and sinus pain. Negative for ear discharge, ear pain, nosebleeds and sore throat.   Eyes: Negative for discharge and redness.  Respiratory: Positive for wheezing.  Negative for cough, sputum production and shortness of breath.   Cardiovascular: Negative for chest pain.  Gastrointestinal: Negative for diarrhea, nausea and vomiting.  Musculoskeletal: Negative for joint pain and myalgias.  Skin: Positive for rash. Negative for itching.  Neurological: Negative for headaches.    All other systems negative unless noted above in HPI  Past medical/social/surgical/family history have been reviewed and are unchanged unless specifically indicated below.  No changes  Medication List: Allergies as of 09/04/2016      Reactions   Iodine Shortness Of Breath, Swelling   Throat closing   Peanuts [peanut Oil] Anaphylaxis   Shellfish Allergy Anaphylaxis   Also iodine : now carries Epi Pen   Aspirin Nausea And Vomiting   Fruit & Vegetable Daily [nutritional Supplements]    Grapes, pears, carrots,       Medication List       Accurate as of 09/04/16  2:45 PM. Always use your most recent med list.          albuterol 108 (90 Base) MCG/ACT inhaler Commonly known as:  PROVENTIL HFA;VENTOLIN HFA Inhale 2 puffs into the lungs every 6 (six) hours as needed for wheezing or shortness of breath.   albuterol (2.5 MG/3ML) 0.083% nebulizer solution Commonly known as:  PROVENTIL Take 2.5 mg by nebulization every 6 (six) hours as needed for wheezing or shortness of  breath.   Calcium 600-200 MG-UNIT tablet Take 1 tablet by mouth daily.   CIPRODEX otic suspension Generic drug:  ciprofloxacin-dexamethasone   DESONATE 0.05 % gel Generic drug:  desonide   FLONASE 50 MCG/ACT nasal spray Generic drug:  fluticasone Place into both nostrils daily.   fluticasone-salmeterol 115-21 MCG/ACT inhaler Commonly known as:  ADVAIR HFA Inhale 2 puffs into the lungs 2 (two) times daily.   montelukast 10 MG tablet Commonly known as:  SINGULAIR Take 10 mg by mouth daily.   MULTIVITAMIN PO Take 1 tablet by mouth daily.   Vitamin D (Ergocalciferol) 50000 units Caps  capsule Commonly known as:  DRISDOL       Known medication allergies: Allergies  Allergen Reactions  . Iodine Shortness Of Breath and Swelling    Throat closing  . Peanuts [Peanut Oil] Anaphylaxis  . Shellfish Allergy Anaphylaxis    Also iodine : now carries Epi Pen  . Aspirin Nausea And Vomiting  . Fruit & Vegetable Daily [Nutritional Supplements]     Grapes, pears, carrots,       Physical examination: Blood pressure 92/62, pulse 88, temperature 98.2 F (36.8 C), temperature source Oral, resp. rate (!) 21, height 5\' 4"  (1.626 m), weight 161 lb 6.4 oz (73.2 kg), SpO2 99 %.  General: Alert, interactive, in no acute distress. HEENT: TMs pearly gray, turbinates mildly edematous with clear discharge, post-pharynx mildly erythematous. Without exudate Neck: Supple without lymphadenopathy. Lungs: Clear to auscultation without wheezing, rhonchi or rales. {no increased work of breathing. CV: Normal S1, S2 without murmurs. Abdomen: Nondistended, nontender. Skin: Dry slightly erythematous patches over bilateral cheeks. Extremities:  No clubbing, cyanosis or edema. Neuro:   Grossly intact.  Diagnositics/Labs:  Spirometry: FEV1: 1.77L  81%, FVC: 2.43L  91%, ratio consistent with Nonobstructive pattern  Assessment and plan:   Asthma, Moderate persistent   - take Symbicort 175mcg 2 puffs twice a day   - use albuterol (ventolin) 2 puffs every 4-6 hours as needed for cough, wheeze, shortness of breath, chest tightness   - continue Singulair 10mg  daily at bedtime  Asthma control goals:   Full participation in all desired activities (may need albuterol before activity)  Albuterol use two time or less a week on average (not counting use with activity)  Cough interfering with sleep two time or less a month  Oral steroids no more than once a year  No hospitalizations   Allergic rhinoconjunctivitis   - continue allergen avoidance measures for tree, grass, weed pollens; molds,  dust mite, cat, dog, cockroach   - continue zyrtec 10mg  daily   - provide with dymista sample take 1 spray twice a day   - singulair as above   - start saline rinse/wash (can find at pharmacy--squeeze bottle option)  Rash  - will change to desonide ointment for more moisturization  Oral allergy syndrome/food allergy  - continue avoidance of current foods  - have access to epinephrine device 0.3mg  for as needed use in case of allergic reaction  Follow-up 4-6 months     I appreciate the opportunity to take part in Aarthi's care. Please do not hesitate to contact me with questions.  Sincerely,   Prudy Feeler, MD Allergy/Immunology [facility]

## 2016-09-04 NOTE — Patient Instructions (Addendum)
Asthma   - take Symbicort 132mcg 2 puffs twice a day   - use albuterol (ventolin) 2 puffs every 4-6 hours as needed for cough, wheeze, shortness of breath, chest tightness   - continue Singulair 10mg  daily at bedtime  Asthma control goals:   Full participation in all desired activities (may need albuterol before activity)  Albuterol use two time or less a week on average (not counting use with activity)  Cough interfering with sleep two time or less a month  Oral steroids no more than once a year  No hospitalizations   Allergies   - continue allergen avoidance measures for tree, grass, weed pollens; molds, dust mite, cat, dog, cockroach   - continue zyrtec 10mg  daily   - provide with dymista sample take 1 puff twice a day   - singulair as above   - start saline rinse/wash (can find at pharmacy--squeeze bottle option)  Rash  - will change to desonide ointment for more moisturization  Oral allergy syndrome/food allergy  - continue avoidance of current foods  - have access to epinephrine device for as needed use in case of allergic reaction  Follow-up 4-6 months

## 2016-09-16 DIAGNOSIS — J320 Chronic maxillary sinusitis: Secondary | ICD-10-CM | POA: Diagnosis not present

## 2016-09-28 DIAGNOSIS — J320 Chronic maxillary sinusitis: Secondary | ICD-10-CM | POA: Diagnosis not present

## 2016-10-07 ENCOUNTER — Other Ambulatory Visit: Payer: Self-pay | Admitting: Family Medicine

## 2016-10-07 DIAGNOSIS — J320 Chronic maxillary sinusitis: Secondary | ICD-10-CM

## 2016-10-13 ENCOUNTER — Ambulatory Visit
Admission: RE | Admit: 2016-10-13 | Discharge: 2016-10-13 | Disposition: A | Discharge status: Home / Self Care | Payer: 59 | Source: Ambulatory Visit | Attending: Family Medicine | Admitting: Family Medicine

## 2016-10-13 DIAGNOSIS — J320 Chronic maxillary sinusitis: Secondary | ICD-10-CM

## 2016-10-13 DIAGNOSIS — J329 Chronic sinusitis, unspecified: Secondary | ICD-10-CM | POA: Diagnosis not present

## 2016-12-07 IMAGING — CR DG CERVICAL SPINE COMPLETE 4+V
6 series · 6 of 6 positions shown · non-contrast
Comparison: None.

CLINICAL DATA: Thoracic outlet syndrome

EXAM:
CERVICAL SPINE - COMPLETE 4+ VIEW

[w cervical spine lat]
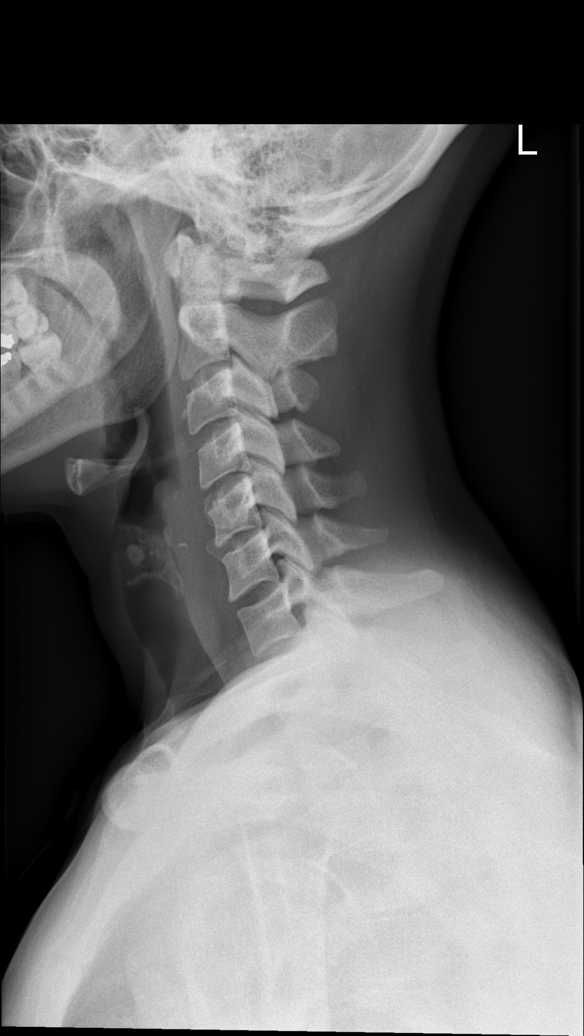

[w cervical spine ap_obl (1 of 2)]
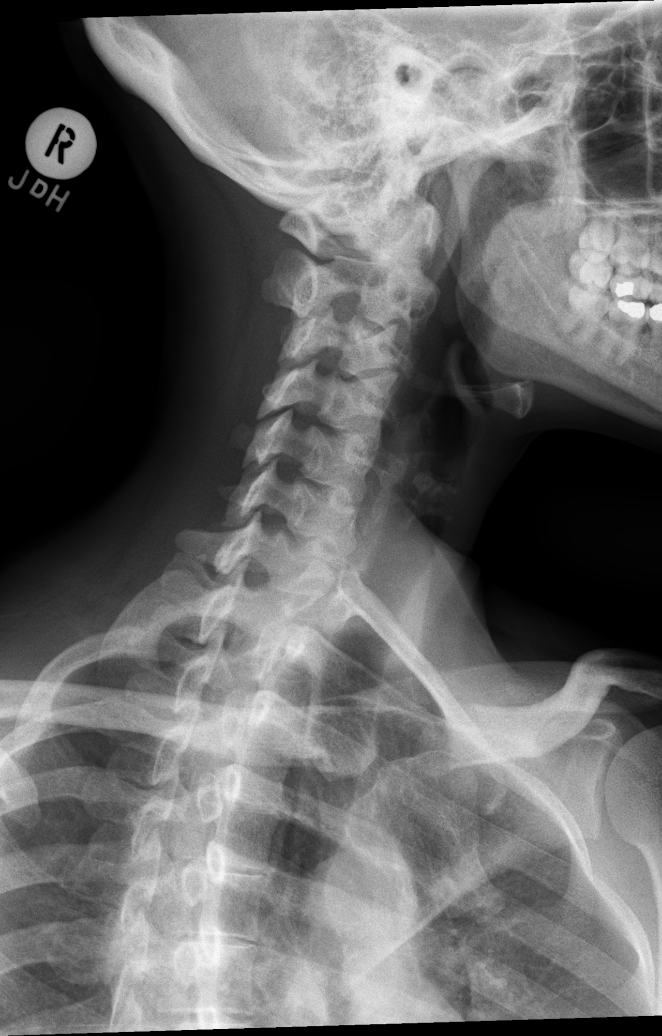

[w cervical spine ap_obl (2 of 2)]
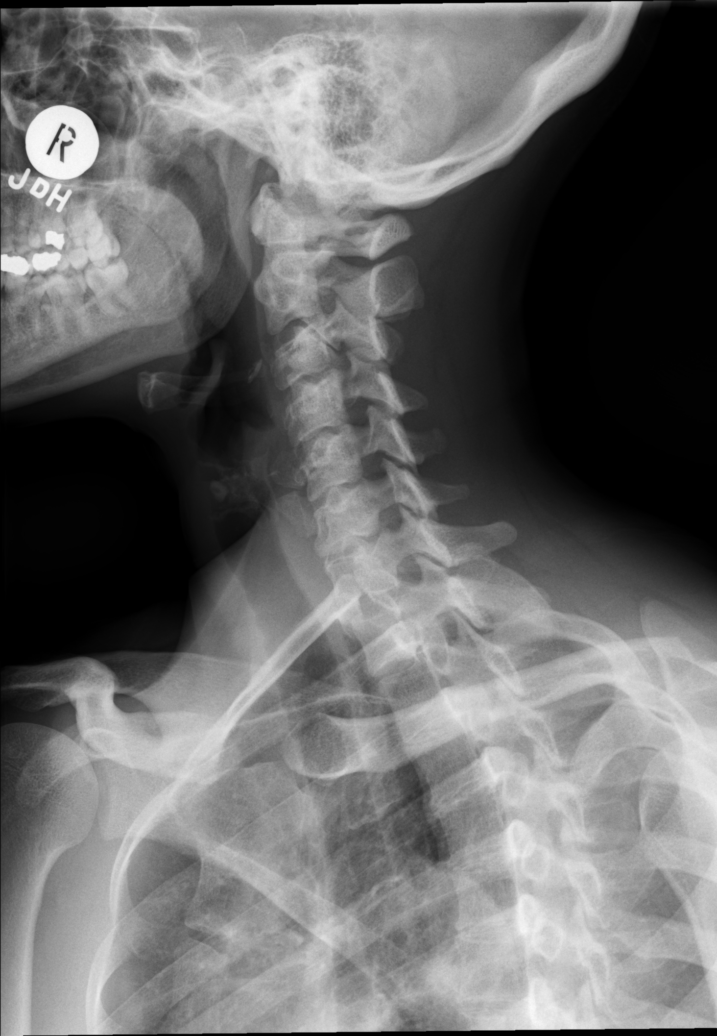

[w cervical spine ap]
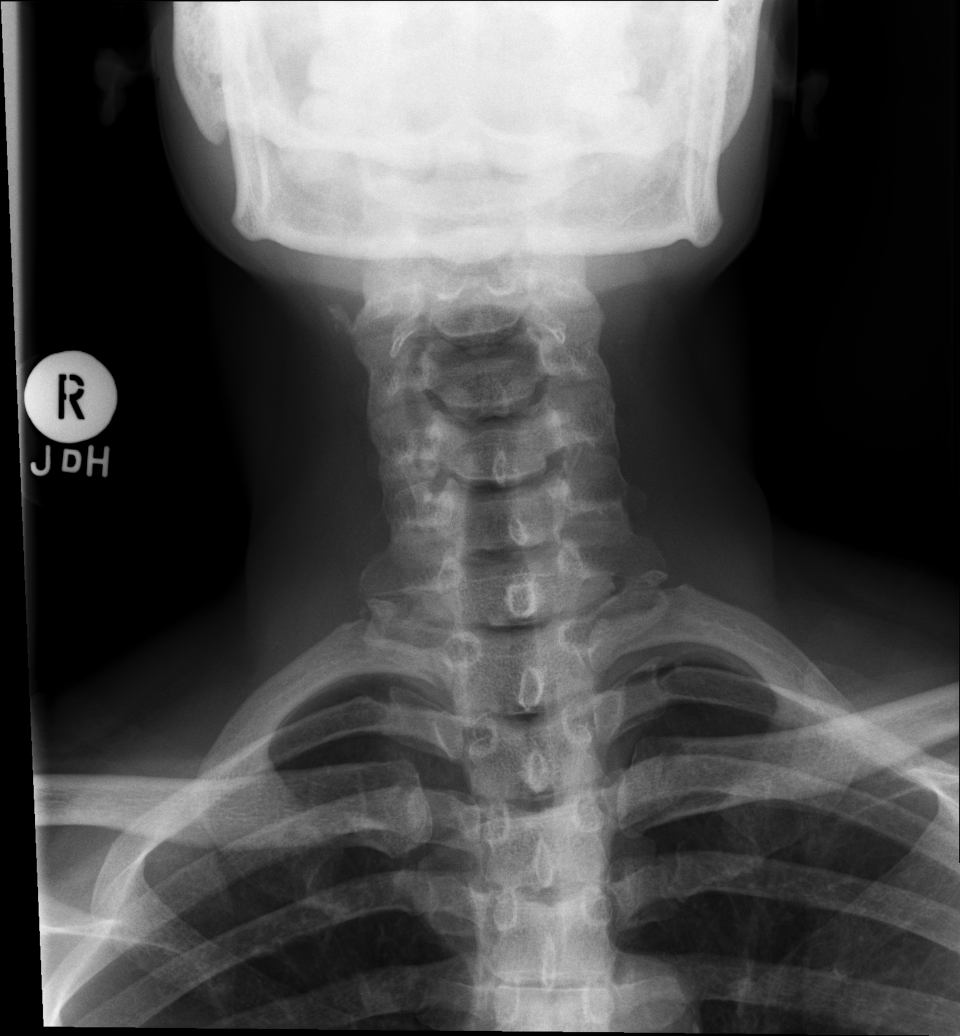

[w cervical swimmers]
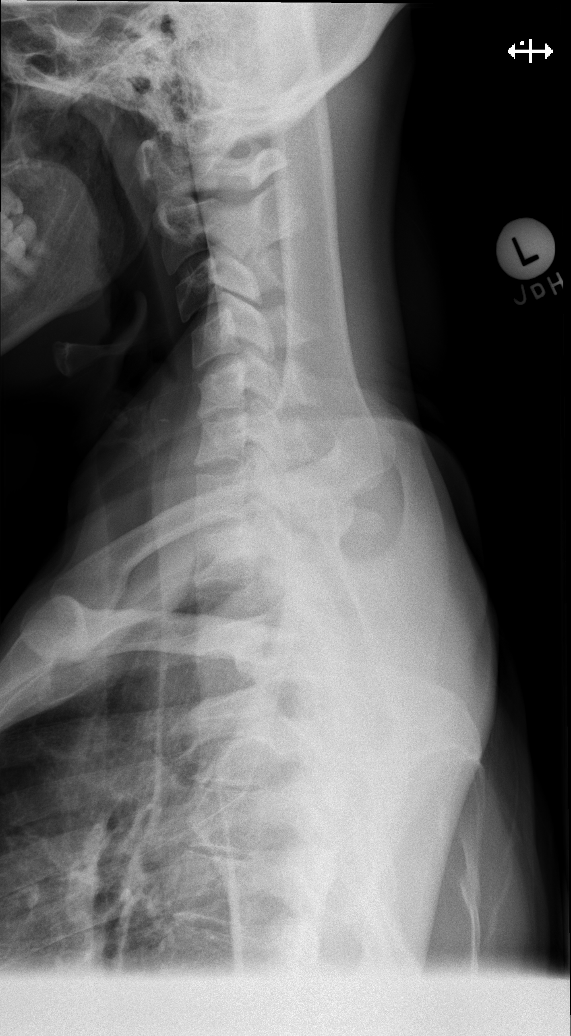

[t cervical spine odontoid]
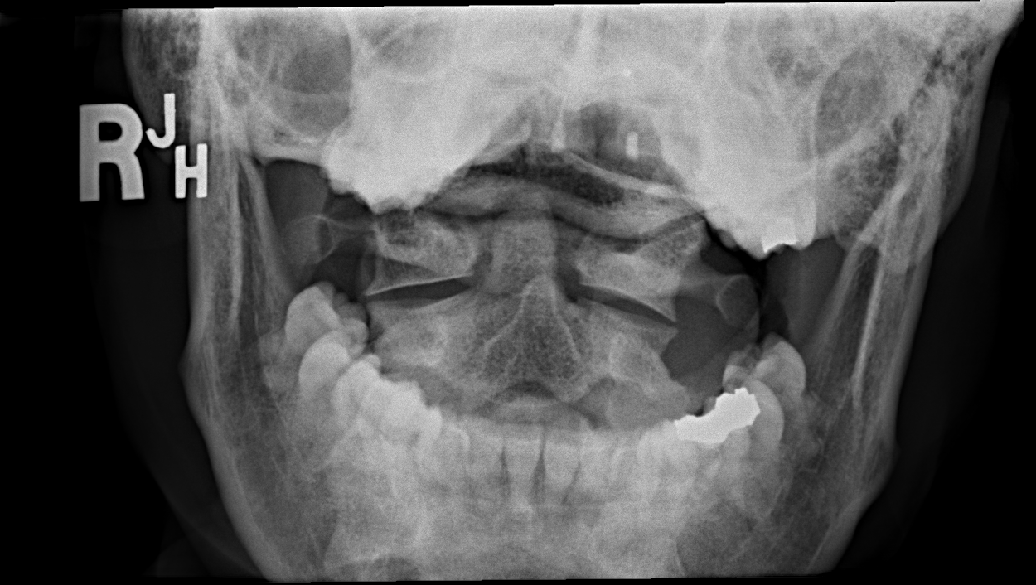

[6 of 6 positions shown; findings below may reference images not displayed]

FINDINGS: Normal alignment. No fracture. Prevertebral soft tissues are normal.
Tiny cervical ribs noted at C7 bilaterally, right slightly lower
with left.
IMPRESSION: No acute bony abnormality.  Tiny cervical ribs bilaterally.

## 2017-01-25 DIAGNOSIS — B308 Other viral conjunctivitis: Secondary | ICD-10-CM | POA: Diagnosis not present

## 2017-01-25 DIAGNOSIS — H6121 Impacted cerumen, right ear: Secondary | ICD-10-CM | POA: Diagnosis not present

## 2017-03-24 DIAGNOSIS — L710 Perioral dermatitis: Secondary | ICD-10-CM | POA: Diagnosis not present

## 2017-03-30 DIAGNOSIS — R21 Rash and other nonspecific skin eruption: Secondary | ICD-10-CM | POA: Diagnosis not present

## 2017-03-30 DIAGNOSIS — L710 Perioral dermatitis: Secondary | ICD-10-CM | POA: Diagnosis not present

## 2017-04-08 DIAGNOSIS — L710 Perioral dermatitis: Secondary | ICD-10-CM | POA: Diagnosis not present

## 2017-04-08 DIAGNOSIS — L650 Telogen effluvium: Secondary | ICD-10-CM | POA: Diagnosis not present

## 2017-04-30 DIAGNOSIS — L710 Perioral dermatitis: Secondary | ICD-10-CM | POA: Diagnosis not present

## 2017-04-30 DIAGNOSIS — L650 Telogen effluvium: Secondary | ICD-10-CM | POA: Diagnosis not present

## 2017-05-14 DIAGNOSIS — Z01419 Encounter for gynecological examination (general) (routine) without abnormal findings: Secondary | ICD-10-CM | POA: Diagnosis not present

## 2017-05-14 DIAGNOSIS — Z1231 Encounter for screening mammogram for malignant neoplasm of breast: Secondary | ICD-10-CM | POA: Diagnosis not present

## 2017-05-24 DIAGNOSIS — Z91018 Allergy to other foods: Secondary | ICD-10-CM | POA: Insufficient documentation

## 2017-05-27 DIAGNOSIS — E559 Vitamin D deficiency, unspecified: Secondary | ICD-10-CM | POA: Diagnosis not present

## 2017-05-27 DIAGNOSIS — R102 Pelvic and perineal pain: Secondary | ICD-10-CM | POA: Diagnosis not present

## 2017-06-14 DIAGNOSIS — Z13220 Encounter for screening for lipoid disorders: Secondary | ICD-10-CM | POA: Diagnosis not present

## 2017-06-14 DIAGNOSIS — Z0001 Encounter for general adult medical examination with abnormal findings: Secondary | ICD-10-CM | POA: Diagnosis not present

## 2017-07-19 DIAGNOSIS — Z13820 Encounter for screening for osteoporosis: Secondary | ICD-10-CM | POA: Diagnosis not present

## 2017-07-19 DIAGNOSIS — Z780 Asymptomatic menopausal state: Secondary | ICD-10-CM | POA: Diagnosis not present

## 2017-09-20 DIAGNOSIS — L669 Cicatricial alopecia, unspecified: Secondary | ICD-10-CM | POA: Diagnosis not present

## 2017-09-20 DIAGNOSIS — L249 Irritant contact dermatitis, unspecified cause: Secondary | ICD-10-CM | POA: Diagnosis not present

## 2018-02-03 DIAGNOSIS — H9201 Otalgia, right ear: Secondary | ICD-10-CM | POA: Diagnosis not present

## 2018-02-03 DIAGNOSIS — H6981 Other specified disorders of Eustachian tube, right ear: Secondary | ICD-10-CM | POA: Diagnosis not present

## 2018-02-03 DIAGNOSIS — J301 Allergic rhinitis due to pollen: Secondary | ICD-10-CM | POA: Diagnosis not present

## 2018-03-02 DIAGNOSIS — N6311 Unspecified lump in the right breast, upper outer quadrant: Secondary | ICD-10-CM | POA: Diagnosis not present

## 2018-03-04 DIAGNOSIS — N6311 Unspecified lump in the right breast, upper outer quadrant: Secondary | ICD-10-CM | POA: Diagnosis not present

## 2018-03-04 DIAGNOSIS — N63 Unspecified lump in unspecified breast: Secondary | ICD-10-CM | POA: Diagnosis not present

## 2018-04-06 DIAGNOSIS — L249 Irritant contact dermatitis, unspecified cause: Secondary | ICD-10-CM | POA: Diagnosis not present

## 2018-05-13 ENCOUNTER — Other Ambulatory Visit (HOSPITAL_COMMUNITY): Payer: Self-pay | Admitting: Family Medicine

## 2018-05-13 ENCOUNTER — Ambulatory Visit (HOSPITAL_COMMUNITY)
Admission: RE | Admit: 2018-05-13 | Discharge: 2018-05-13 | Disposition: A | Discharge status: Home / Self Care | Payer: 59 | Source: Ambulatory Visit | Attending: Family Medicine | Admitting: Family Medicine

## 2018-05-13 DIAGNOSIS — J45909 Unspecified asthma, uncomplicated: Secondary | ICD-10-CM | POA: Diagnosis not present

## 2018-05-13 DIAGNOSIS — R1011 Right upper quadrant pain: Secondary | ICD-10-CM | POA: Insufficient documentation

## 2018-05-13 DIAGNOSIS — K8050 Calculus of bile duct without cholangitis or cholecystitis without obstruction: Secondary | ICD-10-CM

## 2018-05-13 DIAGNOSIS — E559 Vitamin D deficiency, unspecified: Secondary | ICD-10-CM | POA: Diagnosis not present

## 2018-05-13 DIAGNOSIS — Z130 Encounter for screening for diseases of the blood and blood-forming organs and certain disorders involving the immune mechanism: Secondary | ICD-10-CM | POA: Diagnosis not present

## 2018-05-16 DIAGNOSIS — Z01419 Encounter for gynecological examination (general) (routine) without abnormal findings: Secondary | ICD-10-CM | POA: Diagnosis not present

## 2018-05-16 DIAGNOSIS — Z6827 Body mass index (BMI) 27.0-27.9, adult: Secondary | ICD-10-CM | POA: Diagnosis not present

## 2018-05-16 DIAGNOSIS — N951 Menopausal and female climacteric states: Secondary | ICD-10-CM | POA: Diagnosis not present

## 2018-09-02 DIAGNOSIS — Z0000 Encounter for general adult medical examination without abnormal findings: Secondary | ICD-10-CM | POA: Diagnosis not present

## 2018-09-02 DIAGNOSIS — Z136 Encounter for screening for cardiovascular disorders: Secondary | ICD-10-CM | POA: Diagnosis not present

## 2018-09-02 DIAGNOSIS — R238 Other skin changes: Secondary | ICD-10-CM | POA: Diagnosis not present

## 2018-09-02 DIAGNOSIS — Z5181 Encounter for therapeutic drug level monitoring: Secondary | ICD-10-CM | POA: Diagnosis not present

## 2018-09-02 DIAGNOSIS — E559 Vitamin D deficiency, unspecified: Secondary | ICD-10-CM | POA: Diagnosis not present

## 2018-09-12 DIAGNOSIS — R102 Pelvic and perineal pain: Secondary | ICD-10-CM | POA: Diagnosis not present

## 2018-09-12 DIAGNOSIS — N898 Other specified noninflammatory disorders of vagina: Secondary | ICD-10-CM | POA: Diagnosis not present

## 2018-12-21 ENCOUNTER — Other Ambulatory Visit: Payer: Self-pay | Admitting: Pediatric Intensive Care

## 2018-12-21 DIAGNOSIS — Z20822 Contact with and (suspected) exposure to covid-19: Secondary | ICD-10-CM

## 2018-12-26 LAB — NOVEL CORONAVIRUS, NAA: SARS-CoV-2, NAA: NOT DETECTED

## 2019-01-03 ENCOUNTER — Telehealth: Payer: Self-pay | Admitting: Family Medicine

## 2019-01-03 NOTE — Telephone Encounter (Signed)
Advised patient her COVID 19 test results came back negative.  °

## 2020-07-08 ENCOUNTER — Ambulatory Visit: Payer: Self-pay | Admitting: Podiatry

## 2020-07-10 ENCOUNTER — Ambulatory Visit: Payer: Self-pay | Admitting: Podiatry

## 2020-07-10 DIAGNOSIS — R202 Paresthesia of skin: Secondary | ICD-10-CM | POA: Insufficient documentation

## 2020-07-10 DIAGNOSIS — N649 Disorder of breast, unspecified: Secondary | ICD-10-CM | POA: Insufficient documentation

## 2020-07-10 DIAGNOSIS — N951 Menopausal and female climacteric states: Secondary | ICD-10-CM | POA: Insufficient documentation

## 2020-07-10 DIAGNOSIS — C539 Malignant neoplasm of cervix uteri, unspecified: Secondary | ICD-10-CM | POA: Insufficient documentation

## 2020-07-10 DIAGNOSIS — N644 Mastodynia: Secondary | ICD-10-CM | POA: Insufficient documentation

## 2020-07-10 DIAGNOSIS — Z90710 Acquired absence of both cervix and uterus: Secondary | ICD-10-CM | POA: Insufficient documentation

## 2021-05-08 ENCOUNTER — Ambulatory Visit (INDEPENDENT_AMBULATORY_CARE_PROVIDER_SITE_OTHER): Payer: 59

## 2021-05-08 ENCOUNTER — Ambulatory Visit (INDEPENDENT_AMBULATORY_CARE_PROVIDER_SITE_OTHER): Payer: 59 | Admitting: Podiatry

## 2021-05-08 ENCOUNTER — Other Ambulatory Visit: Payer: Self-pay

## 2021-05-08 DIAGNOSIS — M2041 Other hammer toe(s) (acquired), right foot: Secondary | ICD-10-CM

## 2021-05-08 DIAGNOSIS — M2042 Other hammer toe(s) (acquired), left foot: Secondary | ICD-10-CM

## 2021-05-08 DIAGNOSIS — M216X9 Other acquired deformities of unspecified foot: Secondary | ICD-10-CM | POA: Diagnosis not present

## 2021-05-08 NOTE — Progress Notes (Signed)
Subjective:   Patient ID: Monique Munoz, female   DOB: 55 y.o.   MRN: [id number]   HPI Patient states she has had former surgery on her right foot and now her left second toe has become very sore and hard to wear shoe gear with that along with the left joint with elevation of the second toe and digital contracture of the fourth toe.  States that she is tried wider shoes tried to pad it and trim it without with relief and she is tried soaks.  She had good success with the right foot and would like the left foot corrected.  Patient does not smoke likes to be active   Review of Systems  All other systems reviewed and are negative.      Objective:  Physical Exam Vitals and nursing note reviewed.  Constitutional:      Appearance: She is well-developed.  Pulmonary:     Effort: Pulmonary effort is normal.  Musculoskeletal:        General: Normal range of motion.  Skin:    General: Skin is warm.  Neurological:     Mental Status: She is alert.    Neurovascular status intact muscle strength was found to be adequate range of motion adequate with elevated second digit left with severe painful keratotic lesion on the head of the proximal phalanx with pain of the second MPJ left with inflammation of the joint surface and possibility for flexor plate stress.  The fourth toe left has a rotational component to the toe and there is been previous surgery on the right foot with scar second metatarsal second digit.  Good digital perfusion well oriented x3     Assessment:  Hammertoe deformity digit to left with chronic keratotic lesion along with capsulitis elongated second metatarsal left and hammertoe deformity for the left with right foot that gives her slight pain but is overall doing well     Plan:  H&P reviewed x-rays and discussed treatment options.  At this point due to her already having had a right foot done knowing what she wants with the left I did allow her to read consent form today for  digital fusion digit to left along with shortening osteotomy and distal arthroplasty digit for left.  Patient wants surgery and after extensive review of alternative treatments complications signed consent form understanding surgery will take approximately 1 hour and all questions were answered today and understands total recovery can take approximately 6 months.  Patient had an air fracture walker dispensed all instructions on usage and is scheduled for outpatient surgery in the next few weeks and is encouraged to call with any questions concerns which may arise  X-rays indicate that there is elevation of the second toe left foot moderate rigid.  And there is contracture of the fourth toe left along with screw in the second metatarsal right with a shortened second metatarsal and fused second toe

## 2021-05-13 ENCOUNTER — Telehealth: Payer: Self-pay | Admitting: Urology

## 2021-05-13 NOTE — Telephone Encounter (Addendum)
DOS - 06/03/21  METATARSAL OSTEOTOMY 2ND LEFT --- 48185 HAMMERTOE REPAIR 2,4 LEFT --- [id number]  SPOKE WITH HEATHER WITH [employer] AND SHE STATED THAT FOR CPT CODE 70263 AND 78588 NO PRIOR AUTH IS REQUIRED.  REF # HEATHER W. 05/13/21 @ 3:34PM EST    RECEIVED EMAIL FROM [facility] THAT THEY ARE BEING TOLD PRECERT IS REQUIRED. I CALLED BACK TO [facility] AND WENT THROUGH PRECERT PROCESS AGAIN. I SPOKE WITH [employer] AND SHE STATED THAT FOR CPT CODES 50277 AND 41287 NO PRIOR AUTH IS REQUIRED. I INFORMED HER THAT THE [facility] IS BEING TOLD THAT PRECERT IS REQUIRED BUT I HAVE BEEN TOLD TWICE NOW THAT ITS NOT REQUIRED. SHE STATED THAT THERE IS NO RECORD THAT THE [facility] HAD CALLED IN THE PATIENTS CHART JUST SHOWING THE 2 TIMES I HAVE CALLED. SHE REASSURED ME THAT PRIOR AUTH IS NOT REQUIRED. I SENT CYNTHIA WITH [employer] AN EMAIL WITH THIS INFO AND GAVE THE PHONE # THAT I WAS CALLING.   REF # 05/29/21 1:34 EST   TRACY B.  PHONE # I CALLED [phone]

## 2021-06-02 MED ORDER — HYDROCODONE-ACETAMINOPHEN 10-325 MG PO TABS: 1.0000 | ORAL_TABLET | Freq: Three times a day (TID) | ORAL | 0 refills | Status: AC | PRN

## 2021-06-02 NOTE — Addendum Note (Signed)
Addended by: Wallene Huh on: 06/02/2021 01:12 PM   Modules accepted: Orders

## 2021-06-03 ENCOUNTER — Encounter: Payer: Self-pay | Admitting: Podiatry

## 2021-06-03 DIAGNOSIS — Q6689 Other  specified congenital deformities of feet: Secondary | ICD-10-CM | POA: Diagnosis not present

## 2021-06-03 DIAGNOSIS — M2042 Other hammer toe(s) (acquired), left foot: Secondary | ICD-10-CM | POA: Diagnosis not present

## 2021-06-03 DIAGNOSIS — M21542 Acquired clubfoot, left foot: Secondary | ICD-10-CM

## 2021-06-03 DIAGNOSIS — M21272 Flexion deformity, left ankle and toes: Secondary | ICD-10-CM

## 2021-06-09 ENCOUNTER — Encounter: Payer: Self-pay | Admitting: Podiatry

## 2021-06-09 ENCOUNTER — Ambulatory Visit (INDEPENDENT_AMBULATORY_CARE_PROVIDER_SITE_OTHER): Payer: 59 | Admitting: Podiatry

## 2021-06-09 ENCOUNTER — Ambulatory Visit (INDEPENDENT_AMBULATORY_CARE_PROVIDER_SITE_OTHER): Payer: 59

## 2021-06-09 ENCOUNTER — Other Ambulatory Visit: Payer: Self-pay

## 2021-06-09 DIAGNOSIS — Z9889 Other specified postprocedural states: Secondary | ICD-10-CM

## 2021-06-09 NOTE — Progress Notes (Signed)
Subjective:   Patient ID: Monique Munoz, female   DOB: 55 y.o.   MRN: [id number]   HPI Patient states she is doing well with surgery and walking with her boot on   ROS      Objective:  Physical Exam  Neurovascular status intact negative Bevelyn Buckles' sign noted pin intact digit to left good alignment noted wound edges well coapted digit to digit 4 with just slight elevation of the second toe overall     Assessment:  Doing well post osteotomy hammertoe repair     Plan:  H&P x-ray reviewed dispensed surgical shoe explained how to lower the second toe with dressing and will continue this for the next several weeks with stitch removal to be done at that time.  Patient will not go without immobilization and continue elevation and will be seen back and will call if any issues occur  X-rays indicate osteotomy is healing well digit in good alignment pin in place

## 2021-06-26 ENCOUNTER — Ambulatory Visit (INDEPENDENT_AMBULATORY_CARE_PROVIDER_SITE_OTHER): Payer: 59 | Admitting: Podiatry

## 2021-06-26 ENCOUNTER — Encounter: Payer: Self-pay | Admitting: Podiatry

## 2021-06-26 ENCOUNTER — Ambulatory Visit (INDEPENDENT_AMBULATORY_CARE_PROVIDER_SITE_OTHER): Payer: 59

## 2021-06-26 ENCOUNTER — Other Ambulatory Visit: Payer: Self-pay

## 2021-06-26 DIAGNOSIS — Z9889 Other specified postprocedural states: Secondary | ICD-10-CM

## 2021-06-26 DIAGNOSIS — M2041 Other hammer toe(s) (acquired), right foot: Secondary | ICD-10-CM

## 2021-06-26 DIAGNOSIS — M2042 Other hammer toe(s) (acquired), left foot: Secondary | ICD-10-CM | POA: Diagnosis not present

## 2021-06-26 NOTE — Progress Notes (Signed)
Subjective:   Patient ID: Monique Munoz, female   DOB: 55 y.o.   MRN: [id number]   HPI Patient states she hit her foot and her toe has been very sore and she is worried she may have done something to it.  Overall she has been pleased but she is concerned about condition   ROS      Objective:  Physical Exam  Neurovascular status intact negative Bevelyn Buckles' sign noted digit in good alignment second toe wound edges well coapted and mild elevation of the second toe with moderate swelling of the midfoot localized     Assessment:  Trauma to the foot which is created mild increase in edema with no indications of whether there is bony pathology     Plan:  H&P x-rays reviewed and applied a fascial type skin brace in order to lower the second toe and reduce the swelling of the foot.  Advised on continued elevation reappoint 2 weeks pin removal earlier if needed  X-rays indicate that there is good alignment second digit slight elevation of the total metatarsal healing well screw in place

## 2021-07-10 ENCOUNTER — Encounter: Payer: Self-pay | Admitting: Podiatry

## 2021-07-10 ENCOUNTER — Other Ambulatory Visit: Payer: Self-pay

## 2021-07-10 ENCOUNTER — Ambulatory Visit (INDEPENDENT_AMBULATORY_CARE_PROVIDER_SITE_OTHER): Payer: 59

## 2021-07-10 ENCOUNTER — Ambulatory Visit (INDEPENDENT_AMBULATORY_CARE_PROVIDER_SITE_OTHER): Payer: 59 | Admitting: Podiatry

## 2021-07-10 DIAGNOSIS — M2042 Other hammer toe(s) (acquired), left foot: Secondary | ICD-10-CM

## 2021-07-10 DIAGNOSIS — Z9889 Other specified postprocedural states: Secondary | ICD-10-CM

## 2021-07-10 NOTE — Progress Notes (Signed)
Subjective:   Patient ID: Monique Munoz, female   DOB: 56 y.o.   MRN: [id number]   HPI Patient states doing very well with surgery and so far pleased and here to have pin removed second digit   ROS      Objective:  Physical Exam  Neurovascular status intact negative Bevelyn Buckles' sign noted alignment of the second metatarsal second digit fourth digit excellent pin intact second toe     Assessment:  Doing well post forefoot surgery left     Plan:  Went ahead today removed the pin second digit applied sterile dressing instructed on continued home immobilization elevation compression and reappoint to recheck should be uneventful healing  X-rays indicate osteotomies healing well toe in excellent position both toes healing well

## 2022-03-19 DIAGNOSIS — N959 Unspecified menopausal and perimenopausal disorder: Secondary | ICD-10-CM | POA: Diagnosis not present

## 2022-03-19 DIAGNOSIS — Z90711 Acquired absence of uterus with remaining cervical stump: Secondary | ICD-10-CM | POA: Diagnosis not present

## 2022-03-19 DIAGNOSIS — N760 Acute vaginitis: Secondary | ICD-10-CM | POA: Diagnosis not present

## 2022-04-13 DIAGNOSIS — B029 Zoster without complications: Secondary | ICD-10-CM | POA: Diagnosis not present

## 2022-05-14 ENCOUNTER — Other Ambulatory Visit: Payer: Self-pay

## 2022-05-14 ENCOUNTER — Emergency Department (HOSPITAL_COMMUNITY)
Admission: EM | Admit: 2022-05-14 | Discharge: 2022-05-14 | Disposition: A | Discharge status: Home / Self Care | Payer: Self-pay | Attending: Emergency Medicine | Admitting: Emergency Medicine

## 2022-05-14 ENCOUNTER — Emergency Department (HOSPITAL_COMMUNITY): Payer: Self-pay

## 2022-05-14 DIAGNOSIS — Z91010 Allergy to peanuts: Secondary | ICD-10-CM | POA: Diagnosis not present

## 2022-05-14 DIAGNOSIS — S29001A Unspecified injury of muscle and tendon of front wall of thorax, initial encounter: Secondary | ICD-10-CM | POA: Diagnosis present

## 2022-05-14 DIAGNOSIS — Y92410 Unspecified street and highway as the place of occurrence of the external cause: Secondary | ICD-10-CM | POA: Insufficient documentation

## 2022-05-14 DIAGNOSIS — S20212A Contusion of left front wall of thorax, initial encounter: Secondary | ICD-10-CM | POA: Diagnosis not present

## 2022-05-14 DIAGNOSIS — Z91040 Latex allergy status: Secondary | ICD-10-CM | POA: Diagnosis not present

## 2022-05-14 LAB — CBC
HCT: 41.4 % (ref 36.0–46.0)
Hemoglobin: 14.5 g/dL (ref 12.0–15.0)
MCH: 32.9 pg (ref 26.0–34.0)
MCHC: 35 g/dL (ref 30.0–36.0)
MCV: 93.9 fL (ref 80.0–100.0)
Platelets: 280 10*3/uL (ref 150–400)
RBC: 4.41 MIL/uL (ref 3.87–5.11)
RDW: 12.5 % (ref 11.5–15.5)
WBC: 10.2 10*3/uL (ref 4.0–10.5)
nRBC: 0 % (ref 0.0–0.2)

## 2022-05-14 LAB — URINALYSIS, ROUTINE W REFLEX MICROSCOPIC
Bilirubin Urine: NEGATIVE
Glucose, UA: NEGATIVE mg/dL
Hgb urine dipstick: NEGATIVE
Ketones, ur: NEGATIVE mg/dL
Leukocytes,Ua: NEGATIVE
Nitrite: NEGATIVE
Protein, ur: NEGATIVE mg/dL
Specific Gravity, Urine: 1.012 (ref 1.005–1.030)
pH: 5 (ref 5.0–8.0)

## 2022-05-14 LAB — I-STAT CHEM 8, ED
BUN: 15 mg/dL (ref 6–20)
Calcium, Ion: 1.19 mmol/L (ref 1.15–1.40)
Chloride: 105 mmol/L (ref 98–111)
Creatinine, Ser: 0.7 mg/dL (ref 0.44–1.00)
Glucose, Bld: 112 mg/dL — ABNORMAL HIGH (ref 70–99)
HCT: 44 % (ref 36.0–46.0)
Hemoglobin: 15 g/dL (ref 12.0–15.0)
Potassium: 3.8 mmol/L (ref 3.5–5.1)
Sodium: 137 mmol/L (ref 135–145)
TCO2: 23 mmol/L (ref 22–32)

## 2022-05-14 MED ORDER — NAPROXEN 375 MG PO TABS: 375.0000 mg | ORAL_TABLET | Freq: Two times a day (BID) | ORAL | 0 refills | Status: DC

## 2022-05-14 MED ORDER — ACETAMINOPHEN 325 MG PO TABS
650.0000 mg | ORAL_TABLET | Freq: Once | ORAL | Status: AC
  Administered 2022-05-14: 650 mg via ORAL
  Filled 2022-05-14: qty 2

## 2022-05-14 MED ORDER — NAPROXEN 375 MG PO TABS: 375.0000 mg | ORAL_TABLET | Freq: Two times a day (BID) | ORAL | 0 refills | Status: AC

## 2022-05-14 MED ORDER — HYDROCODONE-ACETAMINOPHEN 5-325 MG PO TABS: 1.0000 | ORAL_TABLET | Freq: Four times a day (QID) | ORAL | 0 refills | Status: DC | PRN

## 2022-05-14 MED ORDER — HYDROCODONE-ACETAMINOPHEN 5-325 MG PO TABS: 1.0000 | ORAL_TABLET | Freq: Four times a day (QID) | ORAL | 0 refills | Status: AC | PRN

## 2022-05-14 MED ORDER — CYCLOBENZAPRINE HCL 10 MG PO TABS: 10.0000 mg | ORAL_TABLET | Freq: Two times a day (BID) | ORAL | 0 refills | Status: DC | PRN

## 2022-05-14 MED ORDER — CYCLOBENZAPRINE HCL 10 MG PO TABS: 10.0000 mg | ORAL_TABLET | Freq: Two times a day (BID) | ORAL | 0 refills | Status: AC | PRN
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
## 2022-05-14 NOTE — ED Notes (Signed)
Discharge instructions reviewed with patient. Patient denies any questions or concerns. Patient ambulatory out of ED with family.
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
## 2024-01-26 ENCOUNTER — Ambulatory Visit: Payer: Self-pay

## 2024-01-26 NOTE — Telephone Encounter (Signed)
 FYI Only or Action Required?: FYI only for provider.  Patient called to setup new patient appointment Called Nurse Triage reporting Ear Pain and Neck Pain.  Symptoms began about a month ago.  Interventions attempted: OTC medications: tylenol  and Rest, hydration, or home remedies.  Symptoms are: unchanged.  Triage Disposition: See HCP Within 4 Hours (Or PCP Triage)-recommended to Urgent Care  Patient/caregiver understands and will follow disposition?: Yes  Copied from CRM [id number]. Topic: Clinical - Red Word Triage >> Jan 26, 2024  3:34 PM Wess RAMAN wrote: Red Word that prompted transfer to Nurse Triage: severe Ear and neck pain on right side Reason for Disposition  [1] SEVERE pain (e.g., excruciating) and [2] not improved 2 hours after pain medicine (e.g., acetaminophen  or ibuprofen)  Answer Assessment - Initial Assessment Questions 1. LOCATION: Which ear is involved?     Right ear pain 2. ONSET: When did the ear pain start?      Started a month ago 3. SEVERITY: How bad is the pain?  (Scale 1-10; mild, moderate or severe)     8 out of 10 4. URI SYMPTOMS: Do you have a runny nose or cough?     no 5. FEVER: Do you have a fever? If Yes, ask: What is your temperature, how was it measured, and when did it start?     no 6. CAUSE: Have you been swimming recently?, How often do you use Q-TIPS?, Have you had any recent air travel or scuba diving?     no 7. OTHER SYMPTOMS: Do you have any other symptoms? (e.g., decreased hearing, dizziness, headache, stiff neck, vomiting)     Neck pain, decreased hearing, headache  Protocols used: Earache-A-AH

## 2024-02-29 NOTE — Progress Notes (Deleted)
 New patient visit   Patient: Monique Munoz   DOB: [birth year]   58 y.o. Female  MRN: [id number] Visit Date: 03/01/2024  Today's healthcare provider: Isaiah DELENA Pepper, MD   No chief complaint on file.  Subjective    Monique Munoz is a 58 y.o. female who presents today as a new patient to establish care.   HPI: ***     Past Medical History:  Diagnosis Date   Asthma    Uterine cancer (HCC)    Varicose veins    Past Surgical History:  Procedure Laterality Date   ABDOMINAL HYSTERECTOMY  10 yrs ago   Hx ca   APPENDECTOMY     BREAST BIOPSY     benign   Family Status  Relation Name Status   Father  (Not Specified)   Sister  (Not Specified)   Bruna Nyhan  (Not Specified)   PGM  (Not Specified)   Mother  (Not Specified)   Neg Hx  (Not Specified)  No partnership data on file   Family History  Problem Relation Age of Onset   Heart disease Father    Stroke Father    Cancer Father    Crohn's disease Sister    Colon polyps Sister    Breast cancer Paternal Aunt    Breast cancer Paternal Grandmother    Asthma Mother    Colon cancer Neg Hx    Allergic rhinitis Neg Hx    Angioedema Neg Hx    Eczema Neg Hx    Immunodeficiency Neg Hx    Urticaria Neg Hx    Social History   Socioeconomic History   Marital status: Single    Spouse name: Not on file   Number of children: Not on file   Years of education: Not on file   Highest education level: Not on file  Occupational History   Not on file  Tobacco Use   Smoking status: Never   Smokeless tobacco: Never  Substance and Sexual Activity   Alcohol use: Yes    Alcohol/week: 7.0 standard drinks of alcohol    Types: 7 Glasses of wine per week    Comment: occasional wine   Drug use: No   Sexual activity: Not on file    Comment: hysterectomy   Other Topics Concern   Not on file  Social History Narrative   Not on file   Social Drivers of Health   Financial Resource Strain: Not on file  Food Insecurity: Not on file   Transportation Needs: Not on file  Physical Activity: Not on file  Stress: Not on file  Social Connections: Not on file   Outpatient Medications Prior to Visit  Medication Sig   albuterol (PROVENTIL HFA;VENTOLIN HFA) 108 (90 BASE) MCG/ACT inhaler Inhale 2 puffs into the lungs every 6 (six) hours as needed for wheezing or shortness of breath.   albuterol (PROVENTIL) (2.5 MG/3ML) 0.083% nebulizer solution Take 2.5 mg by nebulization every 6 (six) hours as needed for wheezing or shortness of breath.    budesonide -formoterol  (SYMBICORT ) 160-4.5 MCG/ACT inhaler Inhale 2 puffs into the lungs 2 (two) times daily.   Calcium  600-200 MG-UNIT per tablet Take 1 tablet by mouth daily.   cyclobenzaprine  (FLEXERIL ) 10 MG tablet Take 1 tablet (10 mg total) by mouth 2 (two) times daily as needed for muscle spasms.   EPINEPHrine  (AUVI-Q ) 0.3 mg/0.3 mL IJ SOAJ injection Inject 0.3 mLs (0.3 mg total) into the muscle once.   estradiol (VIVELLE-DOT) 0.1 MG/24HR  patch 1 patch 2 (two) times a week.   estrogens, conjugated, (PREMARIN) 0.3 MG tablet Premarin 0.3 mg tablet   fluticasone -salmeterol (ADVAIR HFA) 115-21 MCG/ACT inhaler Inhale 2 puffs into the lungs 2 (two) times daily.   HYDROcodone -acetaminophen  (NORCO/VICODIN) 5-325 MG tablet Take 1 tablet by mouth every 6 (six) hours as needed.   loratadine (CLARITIN) 10 MG tablet loratadine 10 mg tablet  TK 1 T PO QD   LYLLANA 0.05 MG/24HR patch UNWRAP AND APPLY 1 PATCH TO SKIN TWICE A WEEK AS DIRECTED   Multiple Vitamins-Minerals (MULTIVITAMIN PO) Take 1 tablet by mouth daily.   naproxen  (NAPROSYN ) 375 MG tablet Take 1 tablet (375 mg total) by mouth 2 (two) times daily.   pantoprazole  (PROTONIX ) 20 MG tablet pantoprazole  20 mg tablet,delayed release   No facility-administered medications prior to visit.   Allergies  Allergen Reactions   Iodine Shortness Of Breath and Swelling    Throat closing   Peanuts [Peanut Oil] Anaphylaxis   Shellfish Allergy  Anaphylaxis    Also iodine : now carries Epi Pen   Aspirin Nausea And Vomiting   Fruit & Vegetable Daily [Nutritional Supplements]     Grapes, pears, carrots,     Latex     Reviews of Systems as noted in HPI.  {Insert previous labs (optional):23779} {See past labs  Heme  Chem  Endocrine  Serology  Results Review (optional):1}   Objective    There were no vitals taken for this visit. {Insert last BP/Wt (optional):23777}{See vitals history (optional):1}   Physical Exam  Depression Screen     No data to display         No results found for any visits on 03/01/24.  Assessment & Plan      Problem List Items Addressed This Visit   None   Assessment and Plan              No follow-ups on file.      Isaiah DELENA Pepper, MD  [facility] [phone] (phone) [phone] (fax)

## 2024-03-01 ENCOUNTER — Ambulatory Visit
